# Patient Record
Sex: Female | Born: 1965 | Race: White | Hispanic: No | Marital: Married | Smoking: Never smoker
Health system: Southern US, Community
[De-identification: ages and names within clinical notes are randomized; demographics above are authoritative.]

## PROBLEM LIST (undated history)

## (undated) DIAGNOSIS — K219 Gastro-esophageal reflux disease without esophagitis: Secondary | ICD-10-CM

## (undated) DIAGNOSIS — E039 Hypothyroidism, unspecified: Secondary | ICD-10-CM

## (undated) DIAGNOSIS — R896 Abnormal cytological findings in specimens from other organs, systems and tissues: Secondary | ICD-10-CM

## (undated) DIAGNOSIS — J45909 Unspecified asthma, uncomplicated: Secondary | ICD-10-CM

## (undated) DIAGNOSIS — F909 Attention-deficit hyperactivity disorder, unspecified type: Secondary | ICD-10-CM

## (undated) HISTORY — DX: Gastro-esophageal reflux disease without esophagitis: K21.9

## (undated) HISTORY — DX: Hypothyroidism, unspecified: E03.9

## (undated) HISTORY — PX: TUBAL LIGATION: SHX77

## (undated) HISTORY — DX: Unspecified asthma, uncomplicated: J45.909

## (undated) HISTORY — DX: Abnormal cytological findings in specimens from other organs, systems and tissues: R89.6

## (undated) HISTORY — PX: AUGMENTATION MAMMAPLASTY: SUR837

---

## 2009-05-15 ENCOUNTER — Ambulatory Visit: Payer: Self-pay | Admitting: Diagnostic Radiology

## 2009-05-15 ENCOUNTER — Emergency Department (HOSPITAL_BASED_OUTPATIENT_CLINIC_OR_DEPARTMENT_OTHER): Admission: EM | Admit: 2009-05-15 | Discharge: 2009-05-15 | Payer: Self-pay | Admitting: Emergency Medicine

## 2009-11-12 ENCOUNTER — Emergency Department (HOSPITAL_BASED_OUTPATIENT_CLINIC_OR_DEPARTMENT_OTHER): Admission: EM | Admit: 2009-11-12 | Discharge: 2009-11-12 | Payer: Self-pay | Admitting: Emergency Medicine

## 2009-11-14 ENCOUNTER — Emergency Department (HOSPITAL_BASED_OUTPATIENT_CLINIC_OR_DEPARTMENT_OTHER): Admission: EM | Admit: 2009-11-14 | Discharge: 2009-11-14 | Payer: Self-pay | Admitting: Emergency Medicine

## 2010-04-05 DIAGNOSIS — IMO0001 Reserved for inherently not codable concepts without codable children: Secondary | ICD-10-CM

## 2010-04-05 HISTORY — DX: Reserved for inherently not codable concepts without codable children: IMO0001

## 2010-04-12 ENCOUNTER — Other Ambulatory Visit: Admission: RE | Admit: 2010-04-12 | Discharge: 2010-04-12 | Payer: Self-pay | Admitting: Gynecology

## 2010-04-12 ENCOUNTER — Ambulatory Visit: Payer: Self-pay | Admitting: Gynecology

## 2010-04-13 ENCOUNTER — Ambulatory Visit: Payer: Self-pay | Admitting: Gynecology

## 2010-05-01 ENCOUNTER — Emergency Department (HOSPITAL_BASED_OUTPATIENT_CLINIC_OR_DEPARTMENT_OTHER): Admission: EM | Admit: 2010-05-01 | Discharge: 2010-05-01 | Payer: Self-pay | Admitting: Emergency Medicine

## 2010-05-01 ENCOUNTER — Ambulatory Visit: Payer: Self-pay | Admitting: Diagnostic Radiology

## 2010-06-27 ENCOUNTER — Ambulatory Visit
Admission: RE | Admit: 2010-06-27 | Discharge: 2010-06-27 | Payer: Self-pay | Source: Home / Self Care | Attending: Gynecology | Admitting: Gynecology

## 2010-08-16 LAB — CBC
HCT: 39.1 % (ref 36.0–46.0)
Hemoglobin: 13.5 g/dL (ref 12.0–15.0)
MCHC: 34.6 g/dL (ref 30.0–36.0)
MCV: 88.3 fL (ref 78.0–100.0)

## 2010-08-16 LAB — PREGNANCY, URINE: Preg Test, Ur: NEGATIVE

## 2010-08-16 LAB — URINALYSIS, ROUTINE W REFLEX MICROSCOPIC
Bilirubin Urine: NEGATIVE
Ketones, ur: 15 mg/dL — AB
Nitrite: NEGATIVE
Urobilinogen, UA: 1 mg/dL (ref 0.0–1.0)

## 2010-08-16 LAB — COMPREHENSIVE METABOLIC PANEL
ALT: 14 U/L (ref 0–35)
Alkaline Phosphatase: 110 U/L (ref 39–117)
CO2: 20 mEq/L (ref 19–32)
Calcium: 8.9 mg/dL (ref 8.4–10.5)
GFR calc non Af Amer: >60 mL/min (ref >60)
Glucose, Bld: 119 mg/dL — ABNORMAL HIGH (ref 70–99)
Potassium: 3.6 mEq/L (ref 3.5–5.1)
Sodium: 143 mEq/L (ref 135–145)

## 2010-08-16 LAB — DIFFERENTIAL
Basophils Relative: 0 % (ref 0–1)
Eosinophils Absolute: 0 10*3/uL (ref 0.0–0.7)
Neutrophils Relative %: 91 % — ABNORMAL HIGH (ref 43–77)

## 2010-08-16 LAB — LIPASE, BLOOD: Lipase: 42 U/L (ref 23–300)

## 2010-08-22 LAB — DIFFERENTIAL
Basophils Absolute: 0.1 10*3/uL (ref 0.0–0.1)
Basophils Absolute: 0.2 10*3/uL — ABNORMAL HIGH (ref 0.0–0.1)
Basophils Relative: 1 % (ref 0–1)
Basophils Relative: 2 % — ABNORMAL HIGH (ref 0–1)
Eosinophils Absolute: 0.1 10*3/uL (ref 0.0–0.7)
Eosinophils Relative: 1 % (ref 0–5)
Eosinophils Relative: 1 % (ref 0–5)
Lymphocytes Relative: 13 % (ref 12–46)
Lymphocytes Relative: 15 % (ref 12–46)
Lymphs Abs: 1.4 10*3/uL (ref 0.7–4.0)
Monocytes Absolute: 0.5 10*3/uL (ref 0.1–1.0)
Monocytes Relative: 5 % (ref 3–12)
Neutro Abs: 8.7 10*3/uL — ABNORMAL HIGH (ref 1.7–7.7)
Neutrophils Relative %: 80 % — ABNORMAL HIGH (ref 43–77)

## 2010-08-22 LAB — CBC
HCT: 37.1 % (ref 36.0–46.0)
Hemoglobin: 12.7 g/dL (ref 12.0–15.0)
MCHC: 33.1 g/dL (ref 30.0–36.0)
MCHC: 34.2 g/dL (ref 30.0–36.0)
MCV: 88 fL (ref 78.0–100.0)
MCV: 88.7 fL (ref 78.0–100.0)
Platelets: 232 10*3/uL (ref 150–400)
Platelets: 245 10*3/uL (ref 150–400)
RBC: 4.22 MIL/uL (ref 3.87–5.11)
RDW: 12.9 % (ref 11.5–15.5)
RDW: 13.1 % (ref 11.5–15.5)
WBC: 10.9 10*3/uL — ABNORMAL HIGH (ref 4.0–10.5)
WBC: 13 10*3/uL — ABNORMAL HIGH (ref 4.0–10.5)

## 2010-08-22 LAB — BASIC METABOLIC PANEL
BUN: 12 mg/dL (ref 6–23)
CO2: 25 mEq/L (ref 19–32)
Calcium: 9.4 mg/dL (ref 8.4–10.5)
Chloride: 104 mEq/L (ref 96–112)
Creatinine, Ser: 1 mg/dL (ref 0.4–1.2)
GFR calc Af Amer: >60 mL/min (ref >60)
GFR calc non Af Amer: >60 mL/min (ref >60)
Glucose, Bld: 123 mg/dL — ABNORMAL HIGH (ref 70–99)
Potassium: 4.4 mEq/L (ref 3.5–5.1)
Sodium: 143 mEq/L (ref 135–145)

## 2010-08-22 LAB — COMPREHENSIVE METABOLIC PANEL
AST: 35 U/L (ref 0–37)
Albumin: 4 g/dL (ref 3.5–5.2)
Calcium: 9.1 mg/dL (ref 8.4–10.5)
Chloride: 103 mEq/L (ref 96–112)
Creatinine, Ser: 0.9 mg/dL (ref 0.4–1.2)
GFR calc Af Amer: >60 mL/min (ref >60)
Total Protein: 7.8 g/dL (ref 6.0–8.3)

## 2010-08-22 LAB — ROCKY MTN SPOTTED FVR AB, IGG-BLOOD: RMSF IgG: 0.05 IV

## 2010-08-22 LAB — B. BURGDORFI ANTIBODIES: B burgdorferi Ab IgG+IgM: 0.14 {ISR}

## 2010-09-06 LAB — URINALYSIS, ROUTINE W REFLEX MICROSCOPIC
Bilirubin Urine: NEGATIVE
Hgb urine dipstick: NEGATIVE
Ketones, ur: NEGATIVE mg/dL
Specific Gravity, Urine: 1.021 (ref 1.005–1.030)
pH: 6 (ref 5.0–8.0)

## 2010-09-06 LAB — COMPREHENSIVE METABOLIC PANEL
ALT: 18 U/L (ref 0–35)
Alkaline Phosphatase: 102 U/L (ref 39–117)
BUN: 11 mg/dL (ref 6–23)
CO2: 24 mEq/L (ref 19–32)
Calcium: 9.3 mg/dL (ref 8.4–10.5)
GFR calc non Af Amer: >60 mL/min (ref >60)
Glucose, Bld: 112 mg/dL — ABNORMAL HIGH (ref 70–99)
Total Protein: 7.6 g/dL (ref 6.0–8.3)

## 2010-09-06 LAB — DIFFERENTIAL
Basophils Relative: 1 % (ref 0–1)
Eosinophils Absolute: 0.2 10*3/uL (ref 0.0–0.7)
Monocytes Relative: 6 % (ref 3–12)
Neutro Abs: 4.3 10*3/uL (ref 1.7–7.7)
Neutrophils Relative %: 60 % (ref 43–77)

## 2010-09-06 LAB — POCT CARDIAC MARKERS
Myoglobin, poc: 41.4 ng/mL (ref 12–200)
Troponin i, poc: <0.05 ng/mL (ref 0.00–0.09)

## 2010-09-06 LAB — POCT TOXICOLOGY PANEL

## 2010-09-06 LAB — CBC
HCT: 38.8 % (ref 36.0–46.0)
Hemoglobin: 13.5 g/dL (ref 12.0–15.0)
MCHC: 34.6 g/dL (ref 30.0–36.0)
RBC: 4.38 MIL/uL (ref 3.87–5.11)
RDW: 12.8 % (ref 11.5–15.5)

## 2010-09-06 LAB — LIPASE, BLOOD: Lipase: 47 U/L (ref 23–300)

## 2010-09-06 LAB — URINE MICROSCOPIC-ADD ON

## 2010-09-06 LAB — D-DIMER, QUANTITATIVE: D-Dimer, Quant: <0.22 ug/mL-FEU (ref 0.00–0.48)

## 2010-09-20 ENCOUNTER — Ambulatory Visit (HOSPITAL_COMMUNITY): Payer: Self-pay | Admitting: Psychology

## 2010-10-07 ENCOUNTER — Ambulatory Visit (HOSPITAL_COMMUNITY): Payer: Private Health Insurance - Indemnity | Admitting: Physician Assistant

## 2010-10-07 DIAGNOSIS — F4323 Adjustment disorder with mixed anxiety and depressed mood: Secondary | ICD-10-CM

## 2010-11-03 ENCOUNTER — Encounter (HOSPITAL_COMMUNITY): Payer: Private Health Insurance - Indemnity | Admitting: Physician Assistant

## 2010-11-04 ENCOUNTER — Encounter (HOSPITAL_COMMUNITY): Payer: Private Health Insurance - Indemnity | Admitting: Physician Assistant

## 2010-11-07 ENCOUNTER — Ambulatory Visit (HOSPITAL_COMMUNITY): Payer: Private Health Insurance - Indemnity | Admitting: Marriage and Family Therapist

## 2010-11-26 ENCOUNTER — Emergency Department (HOSPITAL_COMMUNITY): Payer: Private Health Insurance - Indemnity

## 2010-11-26 ENCOUNTER — Emergency Department (HOSPITAL_COMMUNITY)
Admission: EM | Admit: 2010-11-26 | Discharge: 2010-11-26 | Disposition: A | Discharge status: Home / Self Care | Payer: Private Health Insurance - Indemnity | Attending: Emergency Medicine | Admitting: Emergency Medicine

## 2010-11-26 DIAGNOSIS — L298 Other pruritus: Secondary | ICD-10-CM | POA: Insufficient documentation

## 2010-11-26 DIAGNOSIS — F988 Other specified behavioral and emotional disorders with onset usually occurring in childhood and adolescence: Secondary | ICD-10-CM | POA: Insufficient documentation

## 2010-11-26 DIAGNOSIS — L2989 Other pruritus: Secondary | ICD-10-CM | POA: Insufficient documentation

## 2010-11-26 DIAGNOSIS — R071 Chest pain on breathing: Secondary | ICD-10-CM | POA: Insufficient documentation

## 2010-11-26 DIAGNOSIS — E039 Hypothyroidism, unspecified: Secondary | ICD-10-CM | POA: Insufficient documentation

## 2010-11-26 DIAGNOSIS — Z79899 Other long term (current) drug therapy: Secondary | ICD-10-CM | POA: Insufficient documentation

## 2010-11-26 LAB — CBC
MCV: 87 fL (ref 78.0–100.0)
Platelets: 251 10*3/uL (ref 150–400)
RDW: 12.7 % (ref 11.5–15.5)
WBC: 7.3 10*3/uL (ref 4.0–10.5)

## 2010-11-26 LAB — COMPREHENSIVE METABOLIC PANEL
Albumin: 3.2 g/dL — ABNORMAL LOW (ref 3.5–5.2)
BUN: 7 mg/dL (ref 6–23)
Calcium: 9.2 mg/dL (ref 8.4–10.5)
Chloride: 102 mEq/L (ref 96–112)
Creatinine, Ser: 0.78 mg/dL (ref 0.50–1.10)
Total Bilirubin: 0.4 mg/dL (ref 0.3–1.2)

## 2010-11-26 LAB — CK TOTAL AND CKMB (NOT AT ARMC)
CK, MB: 1.6 ng/mL (ref 0.3–4.0)
Relative Index: INVALID (ref 0.0–2.5)
Total CK: 77 U/L (ref 7–177)

## 2010-12-15 ENCOUNTER — Emergency Department (HOSPITAL_COMMUNITY): Payer: Private Health Insurance - Indemnity

## 2010-12-15 ENCOUNTER — Emergency Department (HOSPITAL_COMMUNITY)
Admission: EM | Admit: 2010-12-15 | Discharge: 2010-12-15 | Disposition: A | Discharge status: Home / Self Care | Payer: Private Health Insurance - Indemnity | Attending: Emergency Medicine | Admitting: Emergency Medicine

## 2010-12-15 DIAGNOSIS — R1011 Right upper quadrant pain: Secondary | ICD-10-CM | POA: Insufficient documentation

## 2010-12-15 DIAGNOSIS — E039 Hypothyroidism, unspecified: Secondary | ICD-10-CM | POA: Insufficient documentation

## 2010-12-15 DIAGNOSIS — R1110 Vomiting, unspecified: Secondary | ICD-10-CM | POA: Insufficient documentation

## 2010-12-15 DIAGNOSIS — F988 Other specified behavioral and emotional disorders with onset usually occurring in childhood and adolescence: Secondary | ICD-10-CM | POA: Insufficient documentation

## 2010-12-15 LAB — DIFFERENTIAL
Basophils Absolute: 0.1 10*3/uL (ref 0.0–0.1)
Basophils Relative: 1 % (ref 0–1)
Monocytes Relative: 5 % (ref 3–12)
Neutro Abs: 6.2 10*3/uL (ref 1.7–7.7)
Neutrophils Relative %: 70 % (ref 43–77)

## 2010-12-15 LAB — HEPATIC FUNCTION PANEL
ALT: 12 U/L (ref 0–35)
AST: 17 U/L (ref 0–37)
Albumin: 3.9 g/dL (ref 3.5–5.2)
Alkaline Phosphatase: 74 U/L (ref 39–117)
Bilirubin, Direct: 0.1 mg/dL (ref 0.0–0.3)
Indirect Bilirubin: 0.2 mg/dL — ABNORMAL LOW (ref 0.3–0.9)
Total Bilirubin: 0.3 mg/dL (ref 0.3–1.2)
Total Protein: 7.5 g/dL (ref 6.0–8.3)

## 2010-12-15 LAB — URINALYSIS, ROUTINE W REFLEX MICROSCOPIC
Hgb urine dipstick: NEGATIVE
Nitrite: NEGATIVE
Specific Gravity, Urine: 1.025 (ref 1.005–1.030)
pH: 5.5 (ref 5.0–8.0)

## 2010-12-15 LAB — CBC
Hemoglobin: 14.7 g/dL (ref 12.0–15.0)
MCH: 31.3 pg (ref 26.0–34.0)
RBC: 4.7 MIL/uL (ref 3.87–5.11)

## 2010-12-15 LAB — BASIC METABOLIC PANEL
CO2: 23 mEq/L (ref 19–32)
Glucose, Bld: 113 mg/dL — ABNORMAL HIGH (ref 70–99)
Potassium: 4.1 mEq/L (ref 3.5–5.1)
Sodium: 134 mEq/L — ABNORMAL LOW (ref 135–145)

## 2010-12-15 LAB — LIPASE, BLOOD: Lipase: 16 U/L (ref 11–59)

## 2011-01-27 ENCOUNTER — Emergency Department (HOSPITAL_COMMUNITY)
Admission: EM | Admit: 2011-01-27 | Discharge: 2011-01-27 | Disposition: A | Discharge status: Home / Self Care | Payer: Self-pay | Attending: Emergency Medicine | Admitting: Emergency Medicine

## 2011-01-27 ENCOUNTER — Emergency Department (HOSPITAL_COMMUNITY): Payer: Self-pay

## 2011-01-27 DIAGNOSIS — R109 Unspecified abdominal pain: Secondary | ICD-10-CM | POA: Insufficient documentation

## 2011-01-27 DIAGNOSIS — E039 Hypothyroidism, unspecified: Secondary | ICD-10-CM | POA: Insufficient documentation

## 2011-01-27 DIAGNOSIS — R112 Nausea with vomiting, unspecified: Secondary | ICD-10-CM | POA: Insufficient documentation

## 2011-01-27 DIAGNOSIS — N390 Urinary tract infection, site not specified: Secondary | ICD-10-CM | POA: Insufficient documentation

## 2011-01-27 LAB — POCT I-STAT, CHEM 8
BUN: <3 mg/dL — ABNORMAL LOW (ref 6–23)
Creatinine, Ser: 0.9 mg/dL (ref 0.50–1.10)
Glucose, Bld: 135 mg/dL — ABNORMAL HIGH (ref 70–99)
Hemoglobin: 12.2 g/dL (ref 12.0–15.0)
Potassium: 3.3 mEq/L — ABNORMAL LOW (ref 3.5–5.1)

## 2011-01-27 LAB — URINALYSIS, ROUTINE W REFLEX MICROSCOPIC
Glucose, UA: NEGATIVE mg/dL
Leukocytes, UA: NEGATIVE
Protein, ur: 30 mg/dL — AB
Specific Gravity, Urine: 1.004 — ABNORMAL LOW (ref 1.005–1.030)
pH: 7 (ref 5.0–8.0)

## 2011-01-27 LAB — HEPATIC FUNCTION PANEL
ALT: 10 U/L (ref 0–35)
Indirect Bilirubin: 0.2 mg/dL — ABNORMAL LOW (ref 0.3–0.9)
Total Protein: 7 g/dL (ref 6.0–8.3)

## 2011-01-27 LAB — DIFFERENTIAL
Basophils Absolute: 0 10*3/uL (ref 0.0–0.1)
Lymphocytes Relative: 11 % — ABNORMAL LOW (ref 12–46)
Monocytes Absolute: 1 10*3/uL (ref 0.1–1.0)
Monocytes Relative: 9 % (ref 3–12)
Neutro Abs: 8.9 10*3/uL — ABNORMAL HIGH (ref 1.7–7.7)

## 2011-01-27 LAB — CBC
HCT: 35.3 % — ABNORMAL LOW (ref 36.0–46.0)
Hemoglobin: 11.7 g/dL — ABNORMAL LOW (ref 12.0–15.0)
MCHC: 33.1 g/dL (ref 30.0–36.0)
RBC: 3.88 MIL/uL (ref 3.87–5.11)

## 2011-01-27 LAB — URINE MICROSCOPIC-ADD ON

## 2011-01-27 LAB — LIPASE, BLOOD: Lipase: 20 U/L (ref 11–59)

## 2011-01-29 LAB — URINE CULTURE: Culture  Setup Time: 201208250110

## 2011-04-24 ENCOUNTER — Encounter: Payer: Self-pay | Admitting: *Deleted

## 2011-04-25 ENCOUNTER — Encounter: Payer: Private Health Insurance - Indemnity | Admitting: Gynecology

## 2012-07-23 ENCOUNTER — Encounter (HOSPITAL_COMMUNITY): Payer: Self-pay | Admitting: Emergency Medicine

## 2012-07-23 ENCOUNTER — Emergency Department (HOSPITAL_COMMUNITY): Payer: BC Managed Care – PPO

## 2012-07-23 ENCOUNTER — Emergency Department (HOSPITAL_COMMUNITY)
Admission: EM | Admit: 2012-07-23 | Discharge: 2012-07-23 | Disposition: A | Discharge status: Home / Self Care | Payer: BC Managed Care – PPO | Attending: Emergency Medicine | Admitting: Emergency Medicine

## 2012-07-23 DIAGNOSIS — Z79899 Other long term (current) drug therapy: Secondary | ICD-10-CM | POA: Insufficient documentation

## 2012-07-23 DIAGNOSIS — M542 Cervicalgia: Secondary | ICD-10-CM | POA: Insufficient documentation

## 2012-07-23 DIAGNOSIS — Z791 Long term (current) use of non-steroidal anti-inflammatories (NSAID): Secondary | ICD-10-CM | POA: Insufficient documentation

## 2012-07-23 DIAGNOSIS — M2550 Pain in unspecified joint: Secondary | ICD-10-CM | POA: Insufficient documentation

## 2012-07-23 DIAGNOSIS — R51 Headache: Secondary | ICD-10-CM | POA: Insufficient documentation

## 2012-07-23 DIAGNOSIS — R079 Chest pain, unspecified: Secondary | ICD-10-CM | POA: Insufficient documentation

## 2012-07-23 DIAGNOSIS — R202 Paresthesia of skin: Secondary | ICD-10-CM

## 2012-07-23 DIAGNOSIS — H539 Unspecified visual disturbance: Secondary | ICD-10-CM | POA: Insufficient documentation

## 2012-07-23 DIAGNOSIS — IMO0001 Reserved for inherently not codable concepts without codable children: Secondary | ICD-10-CM | POA: Insufficient documentation

## 2012-07-23 DIAGNOSIS — R109 Unspecified abdominal pain: Secondary | ICD-10-CM | POA: Insufficient documentation

## 2012-07-23 DIAGNOSIS — R209 Unspecified disturbances of skin sensation: Secondary | ICD-10-CM | POA: Insufficient documentation

## 2012-07-23 LAB — CBC WITH DIFFERENTIAL/PLATELET
Basophils Absolute: 0 10*3/uL (ref 0.0–0.1)
Basophils Relative: 0 % (ref 0–1)
Eosinophils Absolute: 0.2 10*3/uL (ref 0.0–0.7)
Eosinophils Relative: 2 % (ref 0–5)
HCT: 38.6 % (ref 36.0–46.0)
MCH: 30.7 pg (ref 26.0–34.0)
MCHC: 34.7 g/dL (ref 30.0–36.0)
MCV: 88.5 fL (ref 78.0–100.0)
Monocytes Absolute: 0.6 10*3/uL (ref 0.1–1.0)
Monocytes Relative: 8 % (ref 3–12)
RDW: 13.1 % (ref 11.5–15.5)

## 2012-07-23 LAB — POCT I-STAT TROPONIN I: Troponin i, poc: 0.01 ng/mL (ref 0.00–0.08)

## 2012-07-23 LAB — BASIC METABOLIC PANEL
BUN: 12 mg/dL (ref 6–23)
Calcium: 9.3 mg/dL (ref 8.4–10.5)
Creatinine, Ser: 0.82 mg/dL (ref 0.50–1.10)
GFR calc Af Amer: >90 mL/min (ref >90)

## 2012-07-23 MED ORDER — ONDANSETRON HCL 4 MG PO TABS: 4.0000 mg | ORAL_TABLET | Freq: Four times a day (QID) | ORAL | Status: DC

## 2012-07-23 MED ORDER — ONDANSETRON HCL 4 MG/2ML IJ SOLN
4.0000 mg | Freq: Once | INTRAMUSCULAR | Status: AC
  Administered 2012-07-23: 4 mg via INTRAVENOUS
  Filled 2012-07-23: qty 2

## 2012-07-23 MED ORDER — MORPHINE SULFATE 4 MG/ML IJ SOLN
4.0000 mg | Freq: Once | INTRAMUSCULAR | Status: AC
  Administered 2012-07-23: 4 mg via INTRAVENOUS
  Filled 2012-07-23: qty 1

## 2012-07-23 MED ORDER — ONDANSETRON HCL 4 MG PO TABS
4.0000 mg | ORAL_TABLET | Freq: Four times a day (QID) | ORAL | Status: DC
Start: 2012-07-23 — End: 2012-09-11

## 2012-07-23 MED ORDER — HYDROCODONE-ACETAMINOPHEN 5-325 MG PO TABS: 2.0000 | ORAL_TABLET | ORAL | Status: DC | PRN

## 2012-07-23 MED ORDER — HYDROCODONE-ACETAMINOPHEN 5-325 MG PO TABS
1.0000 | ORAL_TABLET | Freq: Once | ORAL | Status: AC
  Administered 2012-07-23: 1 via ORAL
  Filled 2012-07-23: qty 1

## 2012-07-23 NOTE — ED Notes (Signed)
Pt transported to radiology.

## 2012-07-23 NOTE — ED Provider Notes (Signed)
History     CSN: [id number]  Arrival date & time 07/23/12  1555   First MD Initiated Contact with Patient 07/23/12 1633      Chief Complaint  Patient presents with  . Numbness    (Consider location/radiation/quality/duration/timing/severity/associated sxs/prior treatment) Patient is a 47 y.o. female presenting with neurologic complaint.  Neurologic Problem The primary symptoms include headaches and paresthesias. Primary symptoms do not include fever, nausea or vomiting. The symptoms began 5 to 7 days ago. The episode lasted 7 days. The symptoms are worsening. The neurological symptoms are multifocal (L side of body).  The headache is associated with paresthesias. The headache is not associated with photophobia, neck stiffness, weakness or loss of balance.   Paresthesias began greater than 24 hours ago. The paresthesias are worsening. The paresthesias are described as burning and pricking. Affected locations include the: head, neck, left side of the face, left shoulder, left upper arm, left forearm, left hand, chest, back, abdomen, left thigh, left distal leg and left foot.  Additional symptoms include pain. Additional symptoms do not include neck stiffness, weakness, loss of balance, photophobia, vertigo or anxiety. Associated medical issues comments: myelofibrosis.    Past Medical History  Diagnosis Date  . ASCUS (atypical squamous cells of undetermined significance) on Pap smear 04/2010    NEG HPV    Past Surgical History  Procedure Laterality Date  . Tubal ligation    . Augmentation mammaplasty      History reviewed. No pertinent family history.  History  Substance Use Topics  . Smoking status: Never Smoker   . Smokeless tobacco: Never Used  . Alcohol Use: Yes     Comment: 1-3 WEEK    OB History   Grav Para Term Preterm Abortions TAB SAB Ect Mult Living   4 3   1     3       Review of Systems  Constitutional: Negative for fever, chills, diaphoresis, activity  change, appetite change and fatigue.  HENT: Positive for neck pain. Negative for congestion, sore throat, facial swelling, rhinorrhea and neck stiffness.   Eyes: Positive for visual disturbance. Negative for photophobia and discharge.  Respiratory: Negative for cough, chest tightness and shortness of breath.   Cardiovascular: Positive for chest pain. Negative for palpitations and leg swelling.  Gastrointestinal: Positive for abdominal pain. Negative for nausea, vomiting and diarrhea.  Endocrine: Negative for polydipsia and polyuria.  Genitourinary: Negative for dysuria, frequency, difficulty urinating and pelvic pain.  Musculoskeletal: Positive for myalgias and arthralgias. Negative for back pain.  Skin: Negative for color change and wound.  Allergic/Immunologic: Negative for immunocompromised state.  Neurological: Positive for headaches and paresthesias. Negative for vertigo, facial asymmetry, weakness, numbness and loss of balance.  Hematological: Does not bruise/bleed easily.  Psychiatric/Behavioral: Negative for confusion and agitation.    Allergies  Review of patient's allergies indicates no known allergies.  Home Medications   Current Outpatient Rx  Name  Route  Sig  Dispense  Refill  . amphetamine-dextroamphetamine (ADDERALL) 20 MG tablet   Oral   Take 20 mg by mouth 3 (three) times daily.         Marland Kitchen amphetamine-dextroamphetamine (ADDERALL) 20 MG tablet   Oral   Take 20 mg by mouth daily as needed (may take additional adderall tablet if needed for school work).         . Biotin 10 MG TABS   Oral   Take 10 mg by mouth daily.         Marland Kitchen  ibuprofen (ADVIL,MOTRIN) 800 MG tablet   Oral   Take 800 mg by mouth 3 (three) times daily.         Marland Kitchen HYDROcodone-acetaminophen (NORCO/VICODIN) 5-325 MG per tablet   Oral   Take 2 tablets by mouth every 4 (four) hours as needed for pain.   10 tablet   0   . ondansetron (ZOFRAN) 4 MG tablet   Oral   Take 1 tablet (4 mg total)  by mouth every 6 (six) hours.   12 tablet   0     BP 121/61  Pulse 62  Temp(Src) 98.2 F (36.8 C) (Oral)  Resp 12  SpO2 98%  LMP 07/02/2012  Physical Exam  Constitutional: She is oriented to person, place, and time. She appears well-developed and well-nourished. No distress.  HENT:  Head: Normocephalic and atraumatic.  Mouth/Throat: No oropharyngeal exudate.  Eyes: Pupils are equal, round, and reactive to light.  Neck: Normal range of motion. Neck supple.  Cardiovascular: Normal rate, regular rhythm and normal heart sounds.  Exam reveals no gallop and no friction rub.   No murmur heard. Pulmonary/Chest: Effort normal and breath sounds normal. No respiratory distress. She has no wheezes. She has no rales.  Abdominal: Soft. Bowel sounds are normal. She exhibits no distension and no mass. There is no tenderness. There is no rebound and no guarding.  Musculoskeletal: Normal range of motion. She exhibits no edema and no tenderness.  Neurological: She is alert and oriented to person, place, and time. A sensory deficit is present. No cranial nerve deficit. GCS eye subscore is 4. GCS verbal subscore is 5. GCS motor subscore is 6.  Appears to have severe pain to light touch of entire L side of her body. DEC LUE/LLE strength likely due to pain.  Pt able to ambulate to bathroom.    Skin: Skin is warm and dry.  Psychiatric: She has a normal mood and affect.    ED Course  Procedures (including critical care time)  Labs Reviewed  BASIC METABOLIC PANEL - Abnormal; Notable for the following:    Glucose, Bld 109 (*)    GFR calc non Af Amer 85 (*)    All other components within normal limits  GLUCOSE, CAPILLARY - Abnormal; Notable for the following:    Glucose-Capillary 104 (*)    All other components within normal limits  CBC WITH DIFFERENTIAL  POCT I-STAT TROPONIN I   Ct Head Wo Contrast  07/23/2012  *RADIOLOGY REPORT*  Clinical Data: Left-sided numbness.  CT HEAD WITHOUT CONTRAST   Technique:  Contiguous axial images were obtained from the base of the skull through the vertex without contrast.  Comparison: 05/15/2009  Findings: No acute intracranial abnormality.  Specifically, no hemorrhage, hydrocephalus, mass lesion, acute infarction, or significant intracranial injury.  No acute calvarial abnormality. Visualized paranasal sinuses and mastoids clear.  Orbital soft tissues unremarkable.  IMPRESSION: Normal study.   Original Report Authenticated By: Charlett Nose, M.D.      1. Pins and needles sensation   2. Paresthesia of left arm and leg       MDM  Pt is a 47 y.o. female with pertinent PMHX of myelofibrosis who presents with L sided pain/paresthesias.  She reports she has had similar symptoms in past w/ her myelofibrosis, and has had chronic intermittent pain of R arm for about 1.5 years, but pain worsening for past week and now involving L face, neck, chest, back, abdomen, leg.  She has also had intermittent blurry vision.  Pain worse w/ even light touch & worse w/ mvmt.  No known injuries or recent illness. VSS, pt in NAD, appear uncomfortable during neuro exam which is difficult due to pain. 4/5 strength LUE, LLE likely due to pain, no CN deficits.  Walked to bathroom after exam. Will attempt to obtain OSH records from [facility] [place] [place] where pt has had care.  CBC, BMP, trop unremarkable.  Have added CT head, but doubt CVA, TIA, acute intracranial bleed. Will r/u electrolyte inbalence.   7:32PM After pt's husband left she called me back in room and reported that I would not be able to find OSH records on her as she used her sister's name when she had her care in [place] as her husband and her were separated and he had cancelled her insurance.  CT, CBC, BMP, CT head here normal, I do not feel she has reason for further emergent w/u.  Will treat pain and d/c home w/ plan for establishment with new oncologist for further workup as well as establishment  with new PCP.  Given timeframe of symptoms and negative CT, doubt CVA, intracranial bleed.    1. Pins and needles sensation   2. Paresthesia of left arm and leg      Labs and imaging considered in decision making, reviewed by myself.  Imaging interpreted by radiology. Pt care discussed with my attending, Dr. Jeraldine Loots.         Toy Cookey, MD 07/24/12 [phone]

## 2012-07-23 NOTE — ED Notes (Signed)
Per EMS pt has hx of myleofibrosis which she is not being treated for and symptoms have increased over the past few days. Pt c/o numbness and pain to entire left side of body. Negative stroke screen.

## 2012-07-24 ENCOUNTER — Telehealth: Payer: Self-pay | Admitting: Oncology

## 2012-07-24 NOTE — Telephone Encounter (Signed)
C/D 07/24/12 for appt. 08/02/12

## 2012-07-24 NOTE — Telephone Encounter (Signed)
S/W pt husband in re NP appt 02/28 @ 10:30 w/Dr. Clelia Croft.  Referring - ED Referral Welcome packet mailed.

## 2012-07-25 NOTE — ED Provider Notes (Signed)
  I performed a history and physical examination of Joy Wilson and discussed her management with Dr. Micheline Maze.  I agree with the history, physical, assessment, and plan of care, with the following exceptions: None  On my exam the patient was in no distress.  Though the patient has hematologic illness, her labs today were unremarkable.  We discussed the need for outpatient followup, and absence of distress, with improvement in her pain, she was stable for discharge. Joy Jarvis, MD 07/25/12 [phone]

## 2012-08-01 ENCOUNTER — Other Ambulatory Visit: Payer: Self-pay | Admitting: Oncology

## 2012-08-02 ENCOUNTER — Ambulatory Visit: Payer: BC Managed Care – PPO

## 2012-08-02 ENCOUNTER — Ambulatory Visit: Payer: BC Managed Care – PPO | Admitting: Oncology

## 2012-08-02 ENCOUNTER — Other Ambulatory Visit: Payer: BC Managed Care – PPO | Admitting: Lab

## 2012-08-20 ENCOUNTER — Ambulatory Visit: Payer: BC Managed Care – PPO

## 2012-08-20 ENCOUNTER — Ambulatory Visit: Payer: BC Managed Care – PPO | Admitting: Oncology

## 2012-08-20 ENCOUNTER — Other Ambulatory Visit: Payer: BC Managed Care – PPO | Admitting: Lab

## 2012-09-10 ENCOUNTER — Telehealth: Payer: Self-pay | Admitting: *Deleted

## 2012-09-10 NOTE — Telephone Encounter (Signed)
Called mobile # not in service. LMOVM at home# requesting pt call back confirming 09/11/12 appt.

## 2012-09-11 ENCOUNTER — Encounter: Payer: Self-pay | Admitting: Oncology

## 2012-09-11 ENCOUNTER — Telehealth: Payer: Self-pay | Admitting: Oncology

## 2012-09-11 ENCOUNTER — Ambulatory Visit: Payer: BC Managed Care – PPO

## 2012-09-11 ENCOUNTER — Ambulatory Visit (HOSPITAL_BASED_OUTPATIENT_CLINIC_OR_DEPARTMENT_OTHER): Payer: BC Managed Care – PPO | Admitting: Oncology

## 2012-09-11 ENCOUNTER — Other Ambulatory Visit (HOSPITAL_BASED_OUTPATIENT_CLINIC_OR_DEPARTMENT_OTHER): Payer: BC Managed Care – PPO | Admitting: Lab

## 2012-09-11 VITALS — BP 127/67 | HR 82 | Temp 97.4°F | Resp 18 | Ht 61.0 in | Wt 163.7 lb

## 2012-09-11 DIAGNOSIS — I998 Other disorder of circulatory system: Secondary | ICD-10-CM

## 2012-09-11 DIAGNOSIS — D649 Anemia, unspecified: Secondary | ICD-10-CM

## 2012-09-11 DIAGNOSIS — M899 Disorder of bone, unspecified: Secondary | ICD-10-CM

## 2012-09-11 DIAGNOSIS — D7581 Myelofibrosis: Secondary | ICD-10-CM

## 2012-09-11 LAB — COMPREHENSIVE METABOLIC PANEL (CC13)
AST: 17 U/L (ref 5–34)
Albumin: 4 g/dL (ref 3.5–5.0)
BUN: 13.2 mg/dL (ref 7.0–26.0)
Calcium: 9.1 mg/dL (ref 8.4–10.4)
Chloride: 105 mEq/L (ref 98–107)
Creatinine: 0.9 mg/dL (ref 0.6–1.1)
Glucose: 87 mg/dl (ref 70–99)
Potassium: 3.9 mEq/L (ref 3.5–5.1)

## 2012-09-11 LAB — CBC WITH DIFFERENTIAL/PLATELET
Basophils Absolute: 0 10*3/uL (ref 0.0–0.1)
EOS%: 0.9 % (ref 0.0–7.0)
Eosinophils Absolute: 0.1 10*3/uL (ref 0.0–0.5)
HCT: 38 % (ref 34.8–46.6)
HGB: 13 g/dL (ref 11.6–15.9)
MCH: 29.8 pg (ref 25.1–34.0)
MCV: 87.2 fL (ref 79.5–101.0)
MONO%: 7.5 % (ref 0.0–14.0)
NEUT#: 4.6 10*3/uL (ref 1.5–6.5)
NEUT%: 65.9 % (ref 38.4–76.8)
RDW: 12.8 % (ref 11.2–14.5)
lymph#: 1.8 10*3/uL (ref 0.9–3.3)

## 2012-09-11 NOTE — Telephone Encounter (Signed)
Gave pt appt for August 2014 lab and MD °

## 2012-09-11 NOTE — Telephone Encounter (Signed)
Gave pt appt for August 2014 lab and MD

## 2012-09-11 NOTE — Progress Notes (Signed)
Checked in new pt with no financial concerns. °

## 2012-09-11 NOTE — Progress Notes (Signed)
Note dictated

## 2012-09-12 NOTE — Progress Notes (Signed)
REASON FOR CONSULTATION:  History of myelofibrosis.  HISTORY OF PRESENT ILLNESS:  Joy Wilson is a 47 year old woman currently of , originally from [place] and lived in multiple locations.  She was living here in [place], relocated to [place] for about 2 years, and returned in around February 2014.  She has really no significant past medical history.  She reports a history of ADHD as an adult and she has been on Adderall, which has helped her symptoms. All throughout her childhood and early adulthood had had constitutional symptoms that ranged from fevers and recurrent infections and bruising and while she was living in [place] she was, per her report, evaluated by her cousin who is a primary care physician and referred her to Oncology and Hematology for a workup.  Per her report, she had an ultrasound showing she had an enlarged spleen and subsequently found to be anemic and had a bone marrow biopsy, and at that time she was told that she has myelofibrosis.  She received a blood transfusion, a total of 3 units, and was started on anti JAK2 inhibitors.  Apparently, her blood counts normalized and she stopped her medication and relocated to [facility] and has felt very well, although she was seen in the emergency department on 07/23/2012 with complaints of paresthesias and headache.  Her workup was really unremarkable and was referred from the emergency department to the [facility] for evaluation for possible myelofibrosis.  Clinically, she feels relatively fair.  She does report pain in her left forearm, pain in her left shin as well as her ribs.  She is still able to perform most activities of daily living. 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  Other than that, she had not had any fevers, had not had any chills, had not had any weight loss.  Appetite had been reasonable.  Again, performance status and activity level are really  unremarkable.  REVIEW OF SYSTEMS:  Does not report any headaches, blurry vision, double vision.  Does not report any motor or sensory neuropathy.  Does not report any alteration in mental status.  Does not report any psychiatric issues, depression.  Does not report any fever, chills, sweats.  Does not report any cough, hemoptysis, hematemesis.  No nausea or vomiting, abdominal pain, hematochezia, melena, or genitourinary complaints.  Rest of review of systems is unremarkable.  PAST MEDICAL HISTORY:  Remarkable for ADHD, history of atypical squamous cells on a Pap smear.  She has had a history of tubal ligation and breast augmentation.  MEDICATION:  She is on ibuprofen as needed, as well as Adderall.  ALLERGIES:  None.  SOCIAL HISTORY:  She is married.  She has 3 children.  Denied any alcohol or tobacco abuse.  FAMILY HISTORY:  Her mother died of a car accident.  She did not know much about her father's family history.  She has 2 aunts with cancer.  PHYSICAL EXAMINATION:  General:  Alert, awake, very pleasant woman, appeared in no active distress.  Vital Signs:  Blood pressure is 127/67, pulse 82, respirations 18, temperature is 98, weighs 163 pounds.  HEENT: Head is normocephalic, atraumatic.  Pupils equal and round, reactive to light.  Oral mucosa moist and pink.  Neck:  Supple.  No lymphadenopathy. Heart:  Regular rate and rhythm.  S1 and S2.  Lungs:  Clear to auscultation without rhonchi, wheeze, dullness to percussion.  Abdomen: Soft, nontender.  No hepatosplenomegaly.  Extremities:  No clubbing, cyanosis, or  edema.  Neurologic:  Intact motor, sensory, and deep tendon reflexes.  I could not appreciate any splenomegaly today.  LABORATORY DATA:  Hemoglobin of 13, white cell count of 7.0, platelet count of 271.  Peripheral smear was personally reviewed today.  I could not appreciate any polychromasia, could not appreciate any nucleated red cells or any dysplasia, could not  appreciate any immature red or white cells.  Again, I could not appreciate any schistocytosis or red cell fragmentation or abnormalities.  ASSESSMENT AND PLAN:  A 47 year old woman with a presumed history of a myelofibrosis.  She gives a history of diffuse bony pain, as well as bruising and history of anemia, which are certainly consistent with myelofibrosis.  She also gives history of having a bone marrow biopsy, ultrasound, as well as being on a JAK2 inhibitor.  I discussed the natural course of myelofibrosis, as well as the different treatment options.  At this time, before we proceed with any further management, I would like to get confirmation of her diagnosis.  I think the easiest way to do this is if we could obtain her medical records so we can spare her any repeated testing at this point.  At this point, she does not recall the name of the doctor nor the location where she was seen and treated.  She recalls it is possibly in [place], [place].  At this time, I urged her to get as much record and as much information for [place] as possible, even if a phone number of a possible provider that we can contact and get that information.  It would be paramount in moving forward.  Otherwise, we will have to repeat any testing.  At this time, her hemoglobin and peripheral smear as well as CBC in general look perfectly normal, so I do not really see any urgent need for any intervention.  Does not need any growth factor support, she does not really need any transfusion, she does not need any anti JAK2 agents that warrant any intervention.  At the same time, I do not want to repeat any testing that has already been done.  So for now, will continue observation and surveillance.  Her pain seems to be manageable, she is able to function, and she seems to be doing fairly well with her current regimen.  I did urge her to establish care with a primary care physician for other health maintenance issue  and I will see her back in 4 months' time, sooner if needed to.  All her questions were answered today.    ______________________________ Benjiman Core, M.D. FNS/MEDQ  D:  09/11/2012  T:  09/12/2012  Job:  [id number]

## 2012-09-20 ENCOUNTER — Telehealth: Payer: Self-pay | Admitting: Dietician

## 2012-10-15 ENCOUNTER — Ambulatory Visit: Payer: BC Managed Care – PPO | Admitting: Internal Medicine

## 2012-10-15 DIAGNOSIS — Z0289 Encounter for other administrative examinations: Secondary | ICD-10-CM

## 2012-12-19 ENCOUNTER — Encounter (HOSPITAL_COMMUNITY): Payer: Self-pay | Admitting: *Deleted

## 2012-12-19 ENCOUNTER — Emergency Department (INDEPENDENT_AMBULATORY_CARE_PROVIDER_SITE_OTHER): Payer: BC Managed Care – PPO

## 2012-12-19 ENCOUNTER — Emergency Department (HOSPITAL_COMMUNITY)
Admission: EM | Admit: 2012-12-19 | Discharge: 2012-12-19 | Disposition: A | Discharge status: Home / Self Care | Payer: BC Managed Care – PPO | Source: Home / Self Care

## 2012-12-19 DIAGNOSIS — S52123A Displaced fracture of head of unspecified radius, initial encounter for closed fracture: Secondary | ICD-10-CM

## 2012-12-19 DIAGNOSIS — M25539 Pain in unspecified wrist: Secondary | ICD-10-CM

## 2012-12-19 DIAGNOSIS — S52121A Displaced fracture of head of right radius, initial encounter for closed fracture: Secondary | ICD-10-CM

## 2012-12-19 DIAGNOSIS — M25531 Pain in right wrist: Secondary | ICD-10-CM

## 2012-12-19 NOTE — ED Notes (Signed)
PLAN OF CARE  TIMELINE  DISCUSSED  WITH  PT    PT  SITTING  UPRIGHT ON  EXAM TABLE IN NO ACUTE  DISTRESS

## 2012-12-19 NOTE — ED Provider Notes (Signed)
Medical screening examination/treatment/procedure(s) were performed by a resident physician or non-physician practitioner and as the supervising physician I was immediately available for consultation/collaboration.  Zana Biancardi, MD   Amos Micheals S Arina Torry, MD 12/19/12 1356 

## 2012-12-19 NOTE — ED Notes (Signed)
Ortho Tech Paged 

## 2012-12-19 NOTE — ED Provider Notes (Signed)
History    CSN: [id number] Arrival date & time 12/19/12  0910  First MD Initiated Contact with Patient 12/19/12 [phone]     Chief Complaint  Patient presents with  . Hand Injury   (Consider location/radiation/quality/duration/timing/severity/associated sxs/prior Treatment) HPI  47 yo wf come in today with complaint of right elbow, wrist, forearm pain and right ue numbness, tingling.  States that on 06 December 2012 she was putting up a tent and fell on outstretched hands onto the pavement.  Immediate pain both wrists, forearm and right elbow.  Next day went to [facility] and xrays were read as negative for fracture.  Left arm ok now, but has continued to have marked discomfort in right elbow.  Cannot supinate or pronate forearm.  Pain radial wrist and mid forearm.  Denies neck pain or shoulder pain.  No other injuries.   Past Medical History  Diagnosis Date  . ASCUS (atypical squamous cells of undetermined significance) on Pap smear 04/2010    NEG HPV   Past Surgical History  Procedure Laterality Date  . Tubal ligation    . Augmentation mammaplasty     No family history on file. History  Substance Use Topics  . Smoking status: Never Smoker   . Smokeless tobacco: Never Used  . Alcohol Use: No     Comment: 1-3 WEEK   OB History   Grav Para Term Preterm Abortions TAB SAB Ect Mult Living   4 3   1     3      Review of Systems  Constitutional: Negative.   HENT: Negative.   Eyes: Negative.   Respiratory: Negative.   Cardiovascular: Negative.   Gastrointestinal: Negative.   Endocrine: Negative.   Genitourinary: Negative.   Musculoskeletal: Positive for joint swelling.  Skin: Negative.   Neurological: Negative.   Psychiatric/Behavioral: Negative.     Allergies  Review of patient's allergies indicates no known allergies.  Home Medications   Current Outpatient Rx  Name  Route  Sig  Dispense  Refill  . amphetamine-dextroamphetamine (ADDERALL) 20 MG tablet  Oral   Take 20 mg by mouth 3 (three) times daily.         Marland Kitchen ibuprofen (ADVIL,MOTRIN) 800 MG tablet   Oral   Take 800 mg by mouth 3 (three) times daily.          BP 109/80  Pulse 71  Temp(Src) 97.6 F (36.4 C) (Oral)  Resp 18  SpO2 100%  LMP 12/13/2012 Physical Exam  Constitutional: She is oriented to person, place, and time. She appears well-developed and well-nourished.  HENT:  Head: Normocephalic and atraumatic.  Eyes: EOM are normal. Pupils are equal, round, and reactive to light.  Neck: Normal range of motion.  No brachial plexus tenderness.   Pulmonary/Chest: Effort normal.  Musculoskeletal:  Right shoulder good rom.  Neg impingement test and drop arm.  Right elbow rom about 20-100 degrees with pain.  Cannot supinate or pronate due to pain.  Right wrist decreased rom.  Mod snuff box tenderness.  Pos tinel over right cubital and carpal tunnel.  Some swelling around right elbow.  Sensation intact.    Neurological: She is alert and oriented to person, place, and time.  Skin: Skin is warm and dry.  Psychiatric: She has a normal mood and affect.    ED Course  Procedures (including critical care time) Labs Reviewed - No data to display Dg Elbow Complete Right  12/19/2012   *RADIOLOGY REPORT*  Clinical  Data:   Hand injury.  RIGHT ELBOW - COMPLETE 3+ VIEW  Comparison: None.  Findings: There is a small joint effusion.  A nondisplaced radial head fracture is suspected. No dislocation.  No radiopaque foreign bodies or soft tissue calcifications identified.  No significant arthropathy.  IMPRESSION:  1.  Joint effusion. 2.  Suspect nondisplaced radial head fracture.   Original Report Authenticated By: Signa Kell, M.D.   Dg Forearm Right  12/19/2012   *RADIOLOGY REPORT*  Clinical Data: Fall 12/06/2012.  Elbow pain.  RIGHT FOREARM - 2 VIEW  Comparison: None  Findings: Fracture of the radial head without significant displacement again noted and similar to the prior study.  Joint  effusion is present.  No fracture of the ulna or humerus. See separate wrist report.  IMPRESSION: Radial head fracture extending into the joint with minimal displacement.   Original Report Authenticated By: Janeece Riggers, M.D.   Dg Wrist Complete Right  12/19/2012   **ADDENDUM** CREATED: 12/19/2012 10:40:35  There is cortical bulge in the lateral aspect of the scaphoid .  I cannot exclude a subtle nondisplaced fracture.  Clinical correlation is necessary.  I reviewed the films with Zonia Kief  **END ADDENDUM** SIGNED BY: Natasha Mead, M.D.  12/19/2012   *RADIOLOGY REPORT*  Clinical Data: Fall, lateral wrist pain  RIGHT WRIST - COMPLETE 3+ VIEW  Comparison: None.  Findings: Four views of the right wrist submitted.  No acute fracture or subluxation.  No radiopaque foreign body.  IMPRESSION: No acute fracture or subluxation.   Original Report Authenticated By: Natasha Mead, M.D.   1. Radial head fracture, closed, right, initial encounter   2. Wrist pain, acute, right     MDM  Patient put in a long arm posterior splint.  Will call dr Cline Cools office today to sched appt for tomorrow.  All questions answered.  No lifting with right arm.  xrays reviewed with patient and husband who is present.    Zonia Kief, PA-C 12/19/12 1239

## 2012-12-19 NOTE — ED Notes (Signed)
Pt  Reports      She  [place] Seat   July  4   Landed  On  Hands      -  She  Reports   That      She  Was  Seen  The  Next  Day  At [facility]  And  She  Says  She  Had  Xray  Of the  r  Wrist and  Forearm  Which  She  Says  Were   Negative   -  She  Reports  Now  Pain  Over  Most of r  Arm  With  Tingling  Sensation      As  Well  As  Bruising of r  Elbow     - she  Is  Sitting  Upright on    Exam table  Speaking in  Complete  sentances

## 2012-12-20 ENCOUNTER — Other Ambulatory Visit: Payer: Self-pay | Admitting: Orthopedic Surgery

## 2012-12-20 DIAGNOSIS — M25531 Pain in right wrist: Secondary | ICD-10-CM

## 2012-12-24 ENCOUNTER — Ambulatory Visit
Admission: RE | Admit: 2012-12-24 | Discharge: 2012-12-24 | Disposition: A | Discharge status: Home / Self Care | Payer: BC Managed Care – PPO | Source: Ambulatory Visit | Attending: Orthopedic Surgery | Admitting: Orthopedic Surgery

## 2012-12-24 DIAGNOSIS — M25531 Pain in right wrist: Secondary | ICD-10-CM

## 2013-01-10 ENCOUNTER — Ambulatory Visit: Payer: BC Managed Care – PPO | Admitting: Oncology

## 2013-01-10 ENCOUNTER — Other Ambulatory Visit: Payer: BC Managed Care – PPO | Admitting: Lab

## 2013-02-27 ENCOUNTER — Ambulatory Visit (INDEPENDENT_AMBULATORY_CARE_PROVIDER_SITE_OTHER): Payer: BC Managed Care – PPO | Admitting: Family Medicine

## 2013-02-27 ENCOUNTER — Encounter: Payer: Self-pay | Admitting: Family Medicine

## 2013-02-27 VITALS — BP 120/84 | Temp 98.6°F | Ht 63.0 in | Wt 178.0 lb

## 2013-02-27 DIAGNOSIS — Z7689 Persons encountering health services in other specified circumstances: Secondary | ICD-10-CM

## 2013-02-27 DIAGNOSIS — Z87898 Personal history of other specified conditions: Secondary | ICD-10-CM | POA: Insufficient documentation

## 2013-02-27 DIAGNOSIS — K219 Gastro-esophageal reflux disease without esophagitis: Secondary | ICD-10-CM

## 2013-02-27 DIAGNOSIS — E039 Hypothyroidism, unspecified: Secondary | ICD-10-CM

## 2013-02-27 DIAGNOSIS — Z8742 Personal history of other diseases of the female genital tract: Secondary | ICD-10-CM

## 2013-02-27 DIAGNOSIS — Z23 Encounter for immunization: Secondary | ICD-10-CM

## 2013-02-27 DIAGNOSIS — Z7189 Other specified counseling: Secondary | ICD-10-CM

## 2013-02-27 DIAGNOSIS — F988 Other specified behavioral and emotional disorders with onset usually occurring in childhood and adolescence: Secondary | ICD-10-CM | POA: Insufficient documentation

## 2013-02-27 LAB — TSH: TSH: 5.71 u[IU]/mL — ABNORMAL HIGH (ref 0.35–5.50)

## 2013-02-27 LAB — LIPID PANEL: HDL: 58.7 mg/dL (ref >39.00)

## 2013-02-27 LAB — LDL CHOLESTEROL, DIRECT: Direct LDL: 136.4 mg/dL

## 2013-02-27 LAB — HEMOGLOBIN A1C: Hgb A1c MFr Bld: 5.7 % (ref 4.6–6.5)

## 2013-02-27 LAB — T4, FREE: Free T4: 0.56 ng/dL — ABNORMAL LOW (ref 0.60–1.60)

## 2013-02-27 NOTE — Progress Notes (Signed)
Chief Complaint  Patient presents with  . Establish Care    HPI:  Joy Wilson is here to establish care. Recently moved back to [place]. Last PCP and physical: 2012 - had pap and was normal - hx remotely abnormal. Hx of mild hypothyroidism on synthroid for 10 years, then was treated by holistic doctor and thyroid studies normalized off of synthroid. Has been off of synthroid for 2 years now.   Has the following chronic problems and concerns today:  Hx of Hypothyroidism: -feels fine -off synthroid for 2 years, used to see Dr. Lucianne Muss for years and would like to see him again if needs to be treated ago -has gained some weight, but has been eating unhealthy and not exercising -no hot/cold intol, abn fatigue, skin changes, palpitations  Patient Active Problem List   Diagnosis Date Noted  . ADD (attention deficit disorder) - treated by Dr. Evelene Croon 02/27/2013  . Unspecified hypothyroidism 02/27/2013  . GERD (gastroesophageal reflux disease) 02/27/2013  . History of abnormal Pap smear 02/27/2013    Health Maintenance: -got flu vaccine today  ROS: See pertinent positives and negatives per HPI.  Past Medical History  Diagnosis Date  . ASCUS (atypical squamous cells of undetermined significance) on Pap smear 04/2010    NEG HPV  . GERD (gastroesophageal reflux disease)   . Hypothyroid     Family History  Problem Relation Age of Onset  . Family history unknown: Yes    History   Social History  . Marital Status: Married    Spouse Name: N/A    Number of Children: N/A  . Years of Education: N/A   Social History Main Topics  . Smoking status: Never Smoker   . Smokeless tobacco: Never Used  . Alcohol Use: No     Comment: 1-3 WEEK  . Drug Use: No  . Sexual Activity: Yes    Birth Control/ Protection: Surgical   Other Topics Concern  . None   Social History Narrative   Work or School: homemaker      Home Situation: lives with husband      Spiritual Beliefs: none       Lifestyle: no regular exercise; diet poor             Current outpatient prescriptions:amphetamine-dextroamphetamine (ADDERALL) 20 MG tablet, Take 20 mg by mouth 3 (three) times daily., Disp: , Rfl: ;  BIOTIN PO, Take by mouth., Disp: , Rfl: ;  ibuprofen (ADVIL,MOTRIN) 800 MG tablet, Take 800 mg by mouth 3 (three) times daily., Disp: , Rfl:   EXAM:  Filed Vitals:   02/27/13 1417  BP: 120/84  Temp: 98.6 F (37 C)    Body mass index is 31.54 kg/(m^2).  GENERAL: vitals reviewed and listed above, alert, oriented, appears well hydrated and in no acute distress  HEENT: atraumatic, conjunttiva clear, no obvious abnormalities on inspection of external nose and ears  NECK: no obvious masses on inspection  LUNGS: clear to auscultation bilaterally, no wheezes, rales or rhonchi, good air movement  CV: HRRR, no peripheral edema  MS: moves all extremities without noticeable abnormality  PSYCH: pleasant and cooperative, no obvious depression or anxiety  ASSESSMENT AND PLAN:  Discussed the following assessment and plan:  ADD (attention deficit disorder) - treated by Dr. Evelene Croon  Need for prophylactic vaccination and inoculation against influenza - Plan: Flu Vaccine QUAD 36+ mos PF IM (Fluarix)  Unspecified hypothyroidism - Plan: TSH, T4, Free  GERD (gastroesophageal reflux disease)  History of abnormal Pap smear  Encounter to establish care - Plan: Lipid Panel, Hemoglobin A1c  -We reviewed the PMH, PSH, FH, SH, Meds and Allergies. -We provided refills for any medications we will prescribe as needed. -We addressed current concerns per orders and patient instructions. -We have asked for records for pertinent exams, studies, vaccines and notes from previous providers. -We have advised patient to follow up per instructions below. -NON-FASTING LABS today -she wants to see Dr. Lucianne Muss if needs synthroid again -advised needs to recheck pap given hx abnormal pap and she will schedule  physical with pap -flu vaccine given today   -Patient advised to return or notify a doctor immediately if symptoms worsen or persist or new concerns arise.  Patient Instructions  -We have ordered labs or studies at this visit. It can take up to 1-2 weeks for results and processing. We will contact you with instructions IF your results are abnormal. Normal results will be released to your [facility]. If you have not heard from [place] or can not find your results in [facility] in 2 weeks please contact our office.  -PLEASE SIGN UP FOR MYCHART TODAY   We recommend the following healthy lifestyle measures: - eat a healthy diet consisting of lots of vegetables, fruits, beans, nuts, seeds, healthy meats such as white chicken and fish and whole grains.  - avoid fried foods, fast food, processed foods, sodas, red meet and other fattening foods.  - get a least 150 minutes of aerobic exercise per week.   Schedule your mammogram  Follow up in: the end of November or December for you physical with pap      Kriste Basque R.

## 2013-02-27 NOTE — Patient Instructions (Signed)
-  We have ordered labs or studies at this visit. It can take up to 1-2 weeks for results and processing. We will contact you with instructions IF your results are abnormal. Normal results will be released to your [facility]. If you have not heard from [place] or can not find your results in [facility] in 2 weeks please contact our office.  -PLEASE SIGN UP FOR MYCHART TODAY   We recommend the following healthy lifestyle measures: - eat a healthy diet consisting of lots of vegetables, fruits, beans, nuts, seeds, healthy meats such as white chicken and fish and whole grains.  - avoid fried foods, fast food, processed foods, sodas, red meet and other fattening foods.  - get a least 150 minutes of aerobic exercise per week.   Schedule your mammogram  Follow up in: the end of November or December for you physical with pap

## 2013-02-28 MED ORDER — LEVOTHYROXINE SODIUM 25 MCG PO TABS: 25.0000 ug | ORAL_TABLET | Freq: Every day | ORAL | Status: DC

## 2013-02-28 NOTE — Progress Notes (Signed)
Quick Note:  Called and spoke with pt and pt is aware. rx sent to pharmacy. ______

## 2013-02-28 NOTE — Addendum Note (Signed)
Addended by: Azucena Freed on: 02/28/2013 04:15 PM   Modules accepted: Orders

## 2013-03-03 ENCOUNTER — Encounter (HOSPITAL_COMMUNITY): Payer: Self-pay | Admitting: Nurse Practitioner

## 2013-03-03 ENCOUNTER — Telehealth: Payer: Self-pay | Admitting: Family Medicine

## 2013-03-03 ENCOUNTER — Emergency Department (HOSPITAL_COMMUNITY)
Admission: EM | Admit: 2013-03-03 | Discharge: 2013-03-03 | Discharge status: Against Medical Advice | Payer: BC Managed Care – PPO | Attending: Emergency Medicine | Admitting: Emergency Medicine

## 2013-03-03 DIAGNOSIS — K3189 Other diseases of stomach and duodenum: Secondary | ICD-10-CM | POA: Insufficient documentation

## 2013-03-03 DIAGNOSIS — K219 Gastro-esophageal reflux disease without esophagitis: Secondary | ICD-10-CM | POA: Insufficient documentation

## 2013-03-03 DIAGNOSIS — M549 Dorsalgia, unspecified: Secondary | ICD-10-CM | POA: Insufficient documentation

## 2013-03-03 DIAGNOSIS — K5900 Constipation, unspecified: Secondary | ICD-10-CM | POA: Insufficient documentation

## 2013-03-03 DIAGNOSIS — R1011 Right upper quadrant pain: Secondary | ICD-10-CM | POA: Insufficient documentation

## 2013-03-03 DIAGNOSIS — Z8742 Personal history of other diseases of the female genital tract: Secondary | ICD-10-CM | POA: Insufficient documentation

## 2013-03-03 DIAGNOSIS — E039 Hypothyroidism, unspecified: Secondary | ICD-10-CM | POA: Insufficient documentation

## 2013-03-03 LAB — CBC WITH DIFFERENTIAL/PLATELET
Basophils Relative: 1 % (ref 0–1)
Eosinophils Absolute: 0.2 10*3/uL (ref 0.0–0.7)
Eosinophils Relative: 2 % (ref 0–5)
HCT: 39.1 % (ref 36.0–46.0)
Hemoglobin: 13.2 g/dL (ref 12.0–15.0)
Lymphocytes Relative: 30 % (ref 12–46)
Lymphs Abs: 2.6 10*3/uL (ref 0.7–4.0)
MCH: 29.3 pg (ref 26.0–34.0)
MCV: 86.9 fL (ref 78.0–100.0)
Monocytes Relative: 9 % (ref 3–12)
Platelets: 294 10*3/uL (ref 150–400)
RBC: 4.5 MIL/uL (ref 3.87–5.11)
WBC: 8.6 10*3/uL (ref 4.0–10.5)

## 2013-03-03 LAB — COMPREHENSIVE METABOLIC PANEL
ALT: 12 U/L (ref 0–35)
AST: 16 U/L (ref 0–37)
Albumin: 4 g/dL (ref 3.5–5.2)
Alkaline Phosphatase: 105 U/L (ref 39–117)
BUN: 12 mg/dL (ref 6–23)
CO2: 23 mEq/L (ref 19–32)
Calcium: 9.2 mg/dL (ref 8.4–10.5)
Creatinine, Ser: 0.79 mg/dL (ref 0.50–1.10)
GFR calc Af Amer: >90 mL/min (ref >90)
Glucose, Bld: 94 mg/dL (ref 70–99)
Sodium: 140 mEq/L (ref 135–145)
Total Bilirubin: 0.5 mg/dL (ref 0.3–1.2)
Total Protein: 8.1 g/dL (ref 6.0–8.3)

## 2013-03-03 NOTE — ED Notes (Signed)
Pt saw PCP earlier this week for severe indigestion, especially at night, and constipation. States she continues to have indigestion and now is having RUQ abd pain radiating into R side/mid back. PCP told pt she should come to er tonight "to have gallbladder checked."

## 2013-03-03 NOTE — ED Notes (Signed)
Pt called from waiting room with no answer 

## 2013-03-03 NOTE — Telephone Encounter (Signed)
Patient Information:  Caller Name: Shawntia  Phone: [phone]  Patient: Joy Wilson, Joy Wilson  Gender: Female  DOB: [birth year]  Age: 47 Years  PCP: Selena Batten (TEXT 1st, after 20 mins can call), [facility]ia Client [facility])  Pregnant: No  Office Follow Up:  Does the office need to follow up with this patient?: No  Instructions For The Office: N/A  RN Note:  Tamp 99 temporal at 1530; last 800 mg Ibuprofen dose at 1400. Severe, constant right flank pain present since 1200 with constant indigestion and flatulance.  Symptoms  Reason For Call & Symptoms: Concerned about gallbladder. Reports severe, constant right flank/back pain worse with movement or deep breath since noon.  Continues to have "indigestion" for the past week followed by gas pain 02/28/13; now has constant pain in right upper quadrant of abdomen made worse with movement, deep pain or palpation and right sided mid-back pain.  Reviewed Health History In EMR: Yes  Reviewed Medications In EMR: Yes  Reviewed Allergies In EMR: Yes  Reviewed Surgeries / Procedures: Yes  Date of Onset of Symptoms: 02/24/2013  Treatments Tried: Ibuprofen, Miralax  Treatments Tried Worked: No OB / GYN:  LMP: 02/03/2013  Guideline(s) Used:  Flank Pain  Disposition Per Guideline:   Go to ED Now  Reason For Disposition Reached:   Severe pain (e.g., excruciating, scale 8-10) and present > 1 hour  Advice Given:  Call Back If:  You become worse.  Patient Will Follow Care Advice:  YES

## 2013-03-04 ENCOUNTER — Emergency Department (HOSPITAL_COMMUNITY): Payer: BC Managed Care – PPO

## 2013-03-04 ENCOUNTER — Encounter (HOSPITAL_COMMUNITY): Payer: Self-pay

## 2013-03-04 ENCOUNTER — Emergency Department (HOSPITAL_COMMUNITY)
Admission: EM | Admit: 2013-03-04 | Discharge: 2013-03-04 | Disposition: A | Discharge status: Home / Self Care | Payer: BC Managed Care – PPO | Attending: Emergency Medicine | Admitting: Emergency Medicine

## 2013-03-04 DIAGNOSIS — Z8639 Personal history of other endocrine, nutritional and metabolic disease: Secondary | ICD-10-CM | POA: Insufficient documentation

## 2013-03-04 DIAGNOSIS — Z3202 Encounter for pregnancy test, result negative: Secondary | ICD-10-CM | POA: Insufficient documentation

## 2013-03-04 DIAGNOSIS — A599 Trichomoniasis, unspecified: Secondary | ICD-10-CM | POA: Insufficient documentation

## 2013-03-04 DIAGNOSIS — Z79899 Other long term (current) drug therapy: Secondary | ICD-10-CM | POA: Insufficient documentation

## 2013-03-04 DIAGNOSIS — Z8719 Personal history of other diseases of the digestive system: Secondary | ICD-10-CM | POA: Insufficient documentation

## 2013-03-04 DIAGNOSIS — Z862 Personal history of diseases of the blood and blood-forming organs and certain disorders involving the immune mechanism: Secondary | ICD-10-CM | POA: Insufficient documentation

## 2013-03-04 DIAGNOSIS — R1011 Right upper quadrant pain: Secondary | ICD-10-CM | POA: Insufficient documentation

## 2013-03-04 LAB — HEPATIC FUNCTION PANEL
ALT: 11 U/L (ref 0–35)
AST: 16 U/L (ref 0–37)
Alkaline Phosphatase: 92 U/L (ref 39–117)
Bilirubin, Direct: 0.1 mg/dL (ref 0.0–0.3)
Indirect Bilirubin: 0.4 mg/dL (ref 0.3–0.9)
Total Bilirubin: 0.5 mg/dL (ref 0.3–1.2)
Total Protein: 7.3 g/dL (ref 6.0–8.3)

## 2013-03-04 LAB — BASIC METABOLIC PANEL
CO2: 21 mEq/L (ref 19–32)
Calcium: 9 mg/dL (ref 8.4–10.5)
Chloride: 104 mEq/L (ref 96–112)
Creatinine, Ser: 0.88 mg/dL (ref 0.50–1.10)
GFR calc Af Amer: 90 mL/min — ABNORMAL LOW (ref >90)
GFR calc non Af Amer: 78 mL/min — ABNORMAL LOW (ref >90)
Glucose, Bld: 84 mg/dL (ref 70–99)
Sodium: 138 mEq/L (ref 135–145)

## 2013-03-04 LAB — PREGNANCY, URINE: Preg Test, Ur: NEGATIVE

## 2013-03-04 LAB — CBC WITH DIFFERENTIAL/PLATELET
Basophils Absolute: 0 10*3/uL (ref 0.0–0.1)
Basophils Relative: 0 % (ref 0–1)
Eosinophils Absolute: 0.1 10*3/uL (ref 0.0–0.7)
Eosinophils Relative: 2 % (ref 0–5)
HCT: 37.2 % (ref 36.0–46.0)
Lymphocytes Relative: 24 % (ref 12–46)
Lymphs Abs: 1.4 10*3/uL (ref 0.7–4.0)
MCH: 30.4 pg (ref 26.0–34.0)
MCV: 87.1 fL (ref 78.0–100.0)
Monocytes Absolute: 0.5 10*3/uL (ref 0.1–1.0)
Neutro Abs: 3.9 10*3/uL (ref 1.7–7.7)
Neutrophils Relative %: 65 % (ref 43–77)
Platelets: 272 10*3/uL (ref 150–400)
RDW: 12.8 % (ref 11.5–15.5)
WBC: 5.9 10*3/uL (ref 4.0–10.5)

## 2013-03-04 LAB — URINALYSIS, ROUTINE W REFLEX MICROSCOPIC
Bilirubin Urine: NEGATIVE
Glucose, UA: NEGATIVE mg/dL
Hgb urine dipstick: NEGATIVE
Ketones, ur: NEGATIVE mg/dL
Protein, ur: NEGATIVE mg/dL
pH: 7 (ref 5.0–8.0)

## 2013-03-04 LAB — URINE MICROSCOPIC-ADD ON

## 2013-03-04 MED ORDER — GI COCKTAIL ~~LOC~~
30.0000 mL | ORAL | Status: DC | PRN
  Administered 2013-03-04: 30 mL via ORAL
  Filled 2013-03-04: qty 30

## 2013-03-04 MED ORDER — METRONIDAZOLE 500 MG PO TABS
2000.0000 mg | ORAL_TABLET | Freq: Once | ORAL | Status: AC
  Administered 2013-03-04: 2000 mg via ORAL
  Filled 2013-03-04: qty 4

## 2013-03-04 NOTE — ED Provider Notes (Signed)
Medical screening examination/treatment/procedure(s) were performed by non-physician practitioner and as supervising physician I was immediately available for consultation/collaboration.  Doug Sou, MD 03/04/13 [phone]

## 2013-03-04 NOTE — ED Notes (Signed)
Pt given medication for indigestion. Also given water to obtain urine specimen.

## 2013-03-04 NOTE — ED Notes (Signed)
Pt complains of indegestion, back and right shoulder pain and constipation, pt reports her md told her to come and have gall bladder checked.

## 2013-03-04 NOTE — ED Notes (Signed)
Contacted lab regarding status of d-dimer, states it is in machine, should result soon.

## 2013-03-04 NOTE — ED Notes (Addendum)
Pt states she just went to restroom, was unaware of urine specimen needed. Will obtain if goes again. States does not need medication right now.

## 2013-03-04 NOTE — ED Provider Notes (Signed)
CSN: [id number]     Arrival date & time 03/04/13  4098 History   First MD Initiated Contact with Patient 03/04/13 [phone]     Chief Complaint  Patient presents with  . Abdominal Pain   (Consider location/radiation/quality/duration/timing/severity/associated sxs/prior Treatment) HPI Comments: Patient is a 47 year old female with a past medical history of GERD and hypothyroidism who presents with a 3 day history of abdominal pain. Symptoms started gradually and progressively worsened since the onset. The pain is located RUQ and radiates into her right chest and right shoulder. The pain is described as aching and severe. No alleviating/aggravating factors. The patient has tried prilosec for symptoms without relief. Associated symptoms include nothing. Patient denies fever, headache, NVD, chest pain, SOB, dysuria, constipation.      Past Medical History  Diagnosis Date  . ASCUS (atypical squamous cells of undetermined significance) on Pap smear 04/2010    NEG HPV  . GERD (gastroesophageal reflux disease)   . Hypothyroid    Past Surgical History  Procedure Laterality Date  . Tubal ligation    . Augmentation mammaplasty     History reviewed. No pertinent family history. History  Substance Use Topics  . Smoking status: Never Smoker   . Smokeless tobacco: Never Used  . Alcohol Use: No     Comment: 1-3 WEEK   OB History   Grav Para Term Preterm Abortions TAB SAB Ect Mult Living   4 3   1     3      Review of Systems  Gastrointestinal: Positive for abdominal pain.  All other systems reviewed and are negative.    Allergies  Review of patient's allergies indicates no known allergies.  Home Medications   Current Outpatient Rx  Name  Route  Sig  Dispense  Refill  . amphetamine-dextroamphetamine (ADDERALL) 20 MG tablet   Oral   Take 20 mg by mouth 2 (two) times daily.          Marland Kitchen BIOTIN PO   Oral   Take 1 tablet by mouth daily.          Marland Kitchen ibuprofen (ADVIL,MOTRIN) 800 MG  tablet   Oral   Take 800 mg by mouth every 8 (eight) hours as needed for pain.           BP 116/78  Pulse 75  Temp(Src) 98 F (36.7 C) (Oral)  Resp 18  SpO2 98%  LMP 02/04/2013 Physical Exam  Nursing note and vitals reviewed. Constitutional: She is oriented to person, place, and time. She appears well-developed and well-nourished. No distress.  HENT:  Head: Normocephalic and atraumatic.  Eyes: Conjunctivae and EOM are normal. Pupils are equal, round, and reactive to light. No scleral icterus.  Neck: Normal range of motion.  Cardiovascular: Normal rate and regular rhythm.  Exam reveals no gallop and no friction rub.   No murmur heard. Pulmonary/Chest: Effort normal and breath sounds normal. She has no wheezes. She has no rales. She exhibits no tenderness.  Abdominal: Soft. She exhibits no distension. There is tenderness. There is no rebound and no guarding.  Positive Murphy's signs. RUQ tenderness to palpation. No other focal tenderness to palpation. No peritoneal signs.   Musculoskeletal: Normal range of motion.  Neurological: She is alert and oriented to person, place, and time. Coordination normal.  Speech is goal-oriented. Moves limbs without ataxia.   Skin: Skin is warm and dry.  Psychiatric: She has a normal mood and affect. Her behavior is normal.    ED  Course  Procedures (including critical care time)   Date: 03/04/2013  Rate: 67  Rhythm: normal sinus rhythm  QRS Axis: normal  Intervals: normal  ST/T Wave abnormalities: normal  Conduction Disutrbances:none  Narrative Interpretation: NSR without previous for comparison  Old EKG Reviewed: none available   Labs Review Labs Reviewed  BASIC METABOLIC PANEL - Abnormal; Notable for the following:    GFR calc non Af Amer 78 (*)    GFR calc Af Amer 90 (*)    All other components within normal limits  URINALYSIS, ROUTINE W REFLEX MICROSCOPIC - Abnormal; Notable for the following:    Urobilinogen, UA 2.0 (*)     Leukocytes, UA SMALL (*)    All other components within normal limits  URINE MICROSCOPIC-ADD ON - Abnormal; Notable for the following:    Bacteria, UA MANY (*)    All other components within normal limits  URINE CULTURE  CBC WITH DIFFERENTIAL  LIPASE, BLOOD  PREGNANCY, URINE  HEPATIC FUNCTION PANEL  D-DIMER, QUANTITATIVE  POCT I-STAT TROPONIN I   Imaging Review US Abdomen Complete  03/04/2013   CLINICAL DATA:  History of abdominal pain.  EXAM: ULTRASOUND ABDOMEN COMPLETE  COMPARISON:  CT 01/27/2011.  FINDINGS: Gallbladder  No gallstones or wall thickening. Negative sonographic Murphy's sign. Gallbladder wall thickness measured 1.8 mm. No pericholecystic fluid evident.  Common bile duct  Diameter: Common bile duct measured 4.7 mm. No choledocholithiasis is evident.  Liver  No focal lesion identified. Within normal limits in parenchymal echogenicity. Portal vein is patent with hepatopetal flow.  IVC  No abnormality visualized.  Pancreas  Visualized portion unremarkable.  Spleen  Size and appearance within normal limits.  Splenic length is 9 cm.  Right Kidney  Length: Right renal length is 10.6 cm. Echogenicity within normal limits. No mass or hydronephrosis visualized.  Left Kidney  Length: Left renal length is 10.1 cm. Echogenicity within normal limits. No mass or hydronephrosis visualized.  Abdominal aorta  No aneurysm visualized.  Maximum diameter is 2.2 cm.  IMPRESSION: No abdominal pathology was identified.   Electronically Signed   By: Onalee Hua  Call M.D.   On: 03/04/2013 09:53    MDM   1. RUQ abdominal pain   2. Trichomonas     9:08 AM Labs pending. Patient declines pain medication at this time. Vitals stable and patient afebrile. Patient will have RUQ [place] to evaluate gallbladder.   2:15 PM [place] and labs unremarkable. Patient has trichomonas in her urine as incidental finding. Patient will be treated for trichomonas here. D-dimer negative. Patient will be referred to Gastroenterology for  further evaluation. Vitals stable and patient afebrile.     Emilia Beck, PA-C 03/04/13 1422

## 2013-03-05 LAB — URINE CULTURE: Special Requests: NORMAL

## 2013-03-06 NOTE — ED Notes (Signed)
Patient informed of positive results and requests that rx be called to  [facility] [phone]

## 2013-03-06 NOTE — ED Notes (Signed)
Post ED Visit - Positive Culture Follow-up: Successful Patient Follow-Up  Culture assessed and recommendations reviewed by: []  Wes Dulaney, Pharm.D., BCPS []  Celedonio Miyamoto, Pharm.D., BCPS []  Georgina Pillion, [address].D., BCPS []  [place], [address].D., BCPS, AAHIVP [x]  Estella Husk, Pharm.D., BCPS, AAHIVP  Positive urine culture X] Patient discharged originally without antimicrobial agent and treatment is now indicated  New antibiotic prescription: Keflex 500mg  PO TID x 5 days  ED Provider: Marlon Pel, PA-C     Larena Sox 03/06/2013, 2:43 PM

## 2013-03-06 NOTE — Progress Notes (Signed)
ED Antimicrobial Stewardship Positive Culture Follow Up   [name] is an 47 y.o. female who presented to [facility] on 03/04/2013 with a chief complaint of  Chief Complaint  Patient presents with  . Abdominal Pain    Recent Results (from the past 720 hour(s))  URINE CULTURE     Status: None   Collection Time    03/04/13 11:25 AM      Result Value Range Status   Specimen Description URINE, CLEAN CATCH   Final   Special Requests Normal   Final   Culture  Setup Time     Final   Value: 03/04/2013 17:26     Performed at [facility] Count     Final   Value: >=100,000 COLONIES/ML     Performed at [facility]   Culture     Final   Value: ESCHERICHIA COLI     Performed at [facility]   Report Status 03/05/2013 FINAL   Final   Organism ID, Bacteria ESCHERICHIA COLI   Final    [x]  Patient discharged originally without antimicrobial agent and treatment is now indicated  New antibiotic prescription: Keflex 500mg  PO TID x 5 days  ED Provider: Marlon Pel, Alroy Bailiff 03/06/2013, 12:05 PM Infectious Diseases Pharmacist Phone# [phone]

## 2013-05-03 ENCOUNTER — Encounter (HOSPITAL_COMMUNITY): Payer: Self-pay | Admitting: Emergency Medicine

## 2013-05-03 ENCOUNTER — Emergency Department (INDEPENDENT_AMBULATORY_CARE_PROVIDER_SITE_OTHER)
Admission: EM | Admit: 2013-05-03 | Discharge: 2013-05-03 | Disposition: A | Discharge status: Home / Self Care | Payer: BC Managed Care – PPO | Source: Home / Self Care | Attending: Family Medicine | Admitting: Family Medicine

## 2013-05-03 DIAGNOSIS — J209 Acute bronchitis, unspecified: Secondary | ICD-10-CM

## 2013-05-03 DIAGNOSIS — J208 Acute bronchitis due to other specified organisms: Secondary | ICD-10-CM

## 2013-05-03 MED ORDER — PREDNISONE 20 MG PO TABS: 40.0000 mg | ORAL_TABLET | Freq: Every day | ORAL | Status: DC

## 2013-05-03 MED ORDER — IPRATROPIUM BROMIDE 0.02 % IN SOLN
0.5000 mg | Freq: Once | RESPIRATORY_TRACT | Status: AC
  Administered 2013-05-03: 0.5 mg via RESPIRATORY_TRACT

## 2013-05-03 MED ORDER — IPRATROPIUM BROMIDE 0.06 % NA SOLN: 2.0000 | Freq: Four times a day (QID) | NASAL | Status: DC

## 2013-05-03 MED ORDER — ALBUTEROL SULFATE (5 MG/ML) 0.5% IN NEBU
5.0000 mg | INHALATION_SOLUTION | Freq: Once | RESPIRATORY_TRACT | Status: AC
  Administered 2013-05-03: 5 mg via RESPIRATORY_TRACT

## 2013-05-03 MED ORDER — IPRATROPIUM BROMIDE 0.02 % IN SOLN
RESPIRATORY_TRACT | Status: AC
  Filled 2013-05-03: qty 2.5

## 2013-05-03 MED ORDER — ALBUTEROL SULFATE (5 MG/ML) 0.5% IN NEBU
INHALATION_SOLUTION | RESPIRATORY_TRACT | Status: AC
  Filled 2013-05-03: qty 1

## 2013-05-03 MED ORDER — SODIUM CHLORIDE 0.9 % IN NEBU
INHALATION_SOLUTION | RESPIRATORY_TRACT | Status: AC
  Filled 2013-05-03: qty 6

## 2013-05-03 MED ORDER — AZITHROMYCIN 250 MG PO TABS: 250.0000 mg | ORAL_TABLET | Freq: Every day | ORAL | Status: DC

## 2013-05-03 MED ORDER — GUAIFENESIN-CODEINE 100-10 MG/5ML PO SOLN: 5.0000 mL | Freq: Every evening | ORAL | Status: DC | PRN

## 2013-05-03 NOTE — ED Provider Notes (Signed)
Joy Wilson is a 47 y.o. female who presents to Urgent Care today for 5 days of nasal discharge congestion cough and some mild subjective shortness of breath. She denies any nausea vomiting or diarrhea. She has remote history of asthma she was a child. No fevers or chills. Husband was recently diagnosed with bronchitis. She feels well otherwise. She's tried over-the-counter medications which have not been very helpful. The cough is productive   Past Medical History  Diagnosis Date  . ASCUS (atypical squamous cells of undetermined significance) on Pap smear 04/2010    NEG HPV  . GERD (gastroesophageal reflux disease)   . Hypothyroid    History  Substance Use Topics  . Smoking status: Never Smoker   . Smokeless tobacco: Never Used  . Alcohol Use: No     Comment: 1-3 WEEK   ROS as above Medications reviewed. No current facility-administered medications for this encounter.   Current Outpatient Prescriptions  Medication Sig Dispense Refill  . amphetamine-dextroamphetamine (ADDERALL) 20 MG tablet Take 20 mg by mouth 2 (two) times daily.       . Levothyroxine Sodium (SYNTHROID PO) Take by mouth.      Marland Kitchen azithromycin (ZITHROMAX) 250 MG tablet Take 1 tablet (250 mg total) by mouth daily. Take first 2 tablets together, then 1 every day until finished.  6 tablet  0  . BIOTIN PO Take 1 tablet by mouth daily.       Marland Kitchen guaiFENesin-codeine 100-10 MG/5ML syrup Take 5 mLs by mouth at bedtime as needed for cough.  120 mL  0  . ibuprofen (ADVIL,MOTRIN) 800 MG tablet Take 800 mg by mouth every 8 (eight) hours as needed for pain.       Marland Kitchen ipratropium (ATROVENT) 0.06 % nasal spray Place 2 sprays into both nostrils 4 (four) times daily.  15 mL  1  . predniSONE (DELTASONE) 20 MG tablet Take 2 tablets (40 mg total) by mouth daily.  10 tablet  0    Exam:  BP 131/88  Pulse 96  Temp(Src) 98.5 F (36.9 C) (Oral)  Resp 16  SpO2 98%  LMP 04/30/2013 Gen: Well NAD HEENT: EOMI,  MMM, posterior pharynx with  cobblestoning. Tympanic membranes normal appearing bilaterally. Lungs: Normal work of breathing. CTABL Heart: RRR no MRG Abd: NABS, Soft. NT, ND Exts: Non edematous BL  LE, warm and well perfused.   Patient was given a DuoNeb nebulizer treatment and had no significant improvement in symptoms.   Assessment and Plan: 47 y.o. female with viral bronchitis. Plan to treat symptomatically with prednisone, Atrovent nasal spray, codeine containing cough medication. If not improved we'll use azithromycin. Follow up with primary care provider. Discussed warning signs or symptoms. Please see discharge instructions. Patient expresses understanding.      Rodolph Bong, MD 05/03/13 1440

## 2013-05-03 NOTE — ED Notes (Signed)
Pt c/o cold sxs onset 2 days Sxs include: congestion, dry cough, SOB Denies: f/v/n/d, wheezing Alert w/no signs of acute distress

## 2013-05-12 ENCOUNTER — Ambulatory Visit (INDEPENDENT_AMBULATORY_CARE_PROVIDER_SITE_OTHER): Payer: BC Managed Care – PPO

## 2013-05-12 DIAGNOSIS — E039 Hypothyroidism, unspecified: Secondary | ICD-10-CM

## 2013-05-19 ENCOUNTER — Other Ambulatory Visit: Payer: Self-pay

## 2013-05-19 ENCOUNTER — Encounter: Payer: Self-pay | Admitting: Family Medicine

## 2013-05-19 ENCOUNTER — Ambulatory Visit (INDEPENDENT_AMBULATORY_CARE_PROVIDER_SITE_OTHER): Payer: BC Managed Care – PPO | Admitting: Family Medicine

## 2013-05-19 VITALS — BP 100/76 | Temp 98.4°F | Wt 184.0 lb

## 2013-05-19 DIAGNOSIS — E785 Hyperlipidemia, unspecified: Secondary | ICD-10-CM

## 2013-05-19 DIAGNOSIS — Z87898 Personal history of other specified conditions: Secondary | ICD-10-CM

## 2013-05-19 DIAGNOSIS — E039 Hypothyroidism, unspecified: Secondary | ICD-10-CM

## 2013-05-19 DIAGNOSIS — Z8742 Personal history of other diseases of the female genital tract: Secondary | ICD-10-CM

## 2013-05-19 DIAGNOSIS — R7309 Other abnormal glucose: Secondary | ICD-10-CM

## 2013-05-19 DIAGNOSIS — R7303 Prediabetes: Secondary | ICD-10-CM

## 2013-05-19 DIAGNOSIS — Z0000 Encounter for general adult medical examination without abnormal findings: Secondary | ICD-10-CM

## 2013-05-19 DIAGNOSIS — Z1231 Encounter for screening mammogram for malignant neoplasm of breast: Secondary | ICD-10-CM

## 2013-05-19 NOTE — Progress Notes (Signed)
Chief Complaint  Patient presents with  . Annual Exam    HPI:  Hypothyroid: -seeing Dr. Lucianne Muss in 2 days for this   Here for CPE:  -Concerns today: none  -Diet: variety of foods, balance and well rounded, larger portion sizes  -Taking folic acid, vit D, calcium: no  -Exercise: no regular exercise  -Diabetes and Dyslipidemia Screening: done 02/2013 with mild dyslipidemia and mild prediabetes  -Hx of HTN: no  -Vaccines: UTD on flu for this year, tdap   -pap history: hx multiple abnormal paps  -FDLMP: nov 24th, normal and regular  -sexual activity: yes, female partner, no new partners  -wants STI testing: no  -FH breast, colon or ovarian ca: see FH -mammo is scheduled  -Alcohol, Tobacco, drug use: see social history  Review of Systems - Review of Systems  Constitutional: Negative for fever, weight loss and malaise/fatigue.  HENT: Negative for hearing loss.   Eyes: Negative for blurred vision and pain.  Respiratory: Negative for shortness of breath.   Cardiovascular: Negative for chest pain, palpitations and leg swelling.  Gastrointestinal: Negative for vomiting, diarrhea, blood in stool and melena.  Genitourinary: Negative for dysuria.  Musculoskeletal: Negative for falls.  Skin: Negative for rash.  Neurological: Negative for dizziness and sensory change.  Endo/Heme/Allergies: Does not bruise/bleed easily.  Psychiatric/Behavioral: Negative for depression. The patient is not nervous/anxious.      Past Medical History  Diagnosis Date  . ASCUS (atypical squamous cells of undetermined significance) on Pap smear 04/2010    NEG HPV  . GERD (gastroesophageal reflux disease)   . Hypothyroid     Past Surgical History  Procedure Laterality Date  . Tubal ligation    . Augmentation mammaplasty      No family history on file.  History   Social History  . Marital Status: Married    Spouse Name: N/A    Number of Children: N/A  . Years of Education: N/A    Social History Main Topics  . Smoking status: Never Smoker   . Smokeless tobacco: Never Used  . Alcohol Use: No     Comment: 1-3 WEEK  . Drug Use: No  . Sexual Activity: Yes    Birth Control/ Protection: Surgical   Other Topics Concern  . None   Social History Narrative   Work or School: homemaker      Home Situation: lives with husband      Spiritual Beliefs: none      Lifestyle: no regular exercise; diet poor             Current outpatient prescriptions:amphetamine-dextroamphetamine (ADDERALL) 20 MG tablet, Take 20 mg by mouth 2 (two) times daily. , Disp: , Rfl: ;  azithromycin (ZITHROMAX) 250 MG tablet, Take 1 tablet (250 mg total) by mouth daily. Take first 2 tablets together, then 1 every day until finished., Disp: 6 tablet, Rfl: 0;  BIOTIN PO, Take 1 tablet by mouth daily. , Disp: , Rfl:  guaiFENesin-codeine 100-10 MG/5ML syrup, Take 5 mLs by mouth at bedtime as needed for cough., Disp: 120 mL, Rfl: 0;  ibuprofen (ADVIL,MOTRIN) 800 MG tablet, Take 800 mg by mouth every 8 (eight) hours as needed for pain. , Disp: , Rfl: ;  ipratropium (ATROVENT) 0.06 % nasal spray, Place 2 sprays into both nostrils 4 (four) times daily., Disp: 15 mL, Rfl: 1;  Levothyroxine Sodium (SYNTHROID PO), Take by mouth., Disp: , Rfl:  predniSONE (DELTASONE) 20 MG tablet, Take 2 tablets (40 mg total) by mouth  daily., Disp: 10 tablet, Rfl: 0  EXAM:  Filed Vitals:   05/19/13 1109  BP: 100/76  Temp: 98.4 F (36.9 C)    GENERAL: vitals reviewed and listed below, alert, oriented, appears well hydrated and in no acute distress  HEENT: head atraumatic, PERRLA, normal appearance of eyes, ears, nose and mouth. moist mucus membranes.  NECK: supple, no masses or lymphadenopathy  LUNGS: clear to auscultation bilaterally, no rales, rhonchi or wheeze  CV: HRRR, no peripheral edema or cyanosis, normal pedal pulses  BREAST: normal appearance - no lesions or discharge, on palpation normal breast tissue  without any suspicious masses  ABDOMEN: bowel sounds normal, soft, non tender to palpation, no masses, no rebound or guarding  GU: deferred  RECTAL: refused  SKIN: no rash or abnormal lesions  MS: normal gait, moves all extremities normally  NEURO: CN II-XII grossly intact, normal muscle strength and sensation to light touch on extremities  PSYCH: normal affect, pleasant and cooperative  ASSESSMENT AND PLAN:  Discussed the following assessment and plan:  Visit for preventive health examination  History of abnormal Pap smear  Unspecified hypothyroidism  Prediabetes  Dyslipidemia  -Discussed and advised all [place] preventive services health task force level A and B recommendations for age, sex and risks.  -Advised at least 150 minutes of exercise per week and a healthy diet low in saturated fats and sweets and consisting of fresh fruits and vegetables, lean meats such as fish and white chicken and whole grains.  -to see Dr. Lucianne Muss for hypothyroidism  -she has questions about fertility and hx of many abnormal paps, so she will see gyn for this  -labs, studies and vaccines per orders this encounter  No orders of the defined types were placed in this encounter.    Patient Instructions  We recommend the following healthy lifestyle measures: - eat a healthy diet consisting of lots of vegetables, fruits, beans, nuts, seeds, healthy meats such as white chicken and fish and whole grains.  - avoid fried foods, fast food, processed foods, sodas, red meet and other fattening foods.  - get a least 150 minutes of aerobic exercise per week.   -Take Vitamin D3 1000 IU daily and 1200mg  calcium daily in diet and supplement  -Schedule appointment with gynecologist for pap and fertility issues  -follow up 4-6 months    Patient advised to return to clinic immediately if symptoms worsen or persist or new concerns.    Return in about 6 months (around 11/17/2013).  Joy Wilson  R.

## 2013-05-19 NOTE — Patient Instructions (Signed)
We recommend the following healthy lifestyle measures: - eat a healthy diet consisting of lots of vegetables, fruits, beans, nuts, seeds, healthy meats such as white chicken and fish and whole grains.  - avoid fried foods, fast food, processed foods, sodas, red meet and other fattening foods.  - get a least 150 minutes of aerobic exercise per week.   -Take Vitamin D3 1000 IU daily and 1200mg  calcium daily in diet and supplement  -Schedule appointment with gynecologist for pap and fertility issues  -follow up 4-6 months

## 2013-05-21 ENCOUNTER — Ambulatory Visit (INDEPENDENT_AMBULATORY_CARE_PROVIDER_SITE_OTHER): Payer: BC Managed Care – PPO | Admitting: Endocrinology

## 2013-05-21 ENCOUNTER — Encounter: Payer: Self-pay | Admitting: Endocrinology

## 2013-05-21 VITALS — BP 124/72 | HR 92 | Temp 98.1°F | Resp 12 | Ht 64.0 in | Wt 180.1 lb

## 2013-05-21 DIAGNOSIS — E7800 Pure hypercholesterolemia, unspecified: Secondary | ICD-10-CM

## 2013-05-21 DIAGNOSIS — R635 Abnormal weight gain: Secondary | ICD-10-CM

## 2013-05-21 DIAGNOSIS — E039 Hypothyroidism, unspecified: Secondary | ICD-10-CM

## 2013-05-21 MED ORDER — LEVOTHYROXINE SODIUM 125 MCG PO TABS: 125.0000 ug | ORAL_TABLET | Freq: Every day | ORAL | Status: DC

## 2013-05-21 NOTE — Progress Notes (Signed)
Patient ID: Joy Wilson, female   DOB: 1966-05-20, 47 y.o.   MRN: [id number]  Reason for Appointment:  Hypothyroidism, new visit   History of Present Illness:   The Hyothyroidism was first diagnosed in 2002 At that time  she had noticed a rapid weight gain of about 30 pounds in 2 months. Records are not available of her evaluation.  She was given a thyroid supplement and she took them for 3-4 years with only modest benefit. She says that her weight did not consistently come down and she did not feel any better otherwise. She stopped the medication for 2-3 years on her own without experiencing symptoms. In 2008 when she was admitted for syncope she had significant hypothyroidism diagnosed and started taking Synthroid, initially 88 mcg and in 2012 this was increased to 125 mcg  In the last 2 years she had been seeing a holistic physician who was given her unknown supplements and a weight loss program. Apparently her thyroid supplements were weaned off and she had been off her Synthroid for about 6 months  The symptoms consistent with hypothyroidism  at this time are: fatigue at times, cold hands, some dry skin, weight gain She has no cold intolerance, constipation, new hair loss or difficulty concentrating  Because of her labs showing hypothyroidism in 9/14 she was started on 25 mcg of levothyroxine by her PCP and she is now here for followup        Lab Results  Component Value Date   FREET4 0.56* 02/27/2013   TSH 24.38* 05/12/2013   TSH 5.71* 02/27/2013     Past Medical History  Diagnosis Date  . ASCUS (atypical squamous cells of undetermined significance) on Pap smear 04/2010    NEG HPV  . GERD (gastroesophageal reflux disease)   . Hypothyroid     Past Surgical History  Procedure Laterality Date  . Tubal ligation    . Augmentation mammaplasty      History reviewed. No pertinent family history.  Social History:  reports that she has never smoked. She has never used smokeless  tobacco. She reports that she does not drink alcohol or use illicit drugs.  Allergies: No Known Allergies    Medication List       This list is accurate as of: 05/21/13 10:35 AM.  Always use your most recent med list.               amphetamine-dextroamphetamine 20 MG tablet  Commonly known as:  ADDERALL  Take 20 mg by mouth 2 (two) times daily.     BIOTIN PO  Take 1 tablet by mouth daily.     ibuprofen 800 MG tablet  Commonly known as:  ADVIL,MOTRIN  Take 800 mg by mouth every 8 (eight) hours as needed for pain.     SYNTHROID PO  Take 25 mcg by mouth.        Review of Systems:  No headaches She was told to watch her glucose although the last few readings are quite normal CARDIOLOGY: no history of high blood pressure.            GASTROENTEROLOGY:  no Change in bowel habits.      No swelling of her legs       She has regular menstrual cycles She has mild hypercholesterolemia, last LDL 134   Examination:    BP 124/72  Pulse 92  Temp(Src) 98.1 F (36.7 C)  Resp 12  Ht 5\' 4"  (1.626 m)  Wt 180  lb 1.6 oz (81.693 kg)  BMI 30.90 kg/m2  SpO2 98%  LMP 04/30/2013   General Appearance: pleasant, well-built and nourished, generalized obesity present         Eyes: No proptosis or eyelid swelling .          Neck: The thyroid is nonpalpable  There is no lymphadenopathy .    Cardiovascular: Normal  heart sounds, no murmur    Neurological: REFLEXES: at biceps are 1+ and are normal at the ankles with  normal relaxation    Skin: moist, warm, no rash        Assessments    Hypothyroidism, primary and long-standing. She may possibly have a component of secondary hypothyroidism since her free T4 was low in 9/14 despite only mild increase in TSH However does not appear to have any other symptoms suggestive of hypopituitarism She is not very symptomatic with her hypothyroidism despite significantly high TSH levels. Her main difficulty has been weight gain. However she has  had other issues like ADD which may make her symptoms difficult to sort out  Hypercholesterolemia, mild with no other risk factors  Treatment:    Since her TSH is significantly high and she had required 125 mcg Synthroid previously when her weight was about the same she will go back to this dose Have discussed needing to check the TSH periodically and regular followup She needs to take the medication in the morning before breakfast Discussed benefits of staying with a brand name supplement to avoid fluctuation in the dose, given her a co-pay card She will followup in 6 weeks with repeat TSH and free T4  Caylan Chenard 05/21/2013, 10:35 AM

## 2013-05-21 NOTE — Patient Instructions (Signed)
Synthroid 125ug daily

## 2013-06-04 ENCOUNTER — Emergency Department (HOSPITAL_COMMUNITY)
Admission: EM | Admit: 2013-06-04 | Discharge: 2013-06-04 | Disposition: A | Discharge status: Home / Self Care | Payer: BC Managed Care – PPO | Attending: Emergency Medicine | Admitting: Emergency Medicine

## 2013-06-04 ENCOUNTER — Emergency Department (INDEPENDENT_AMBULATORY_CARE_PROVIDER_SITE_OTHER)
Admission: EM | Admit: 2013-06-04 | Discharge: 2013-06-04 | Disposition: A | Discharge status: Nursing Home (Includes ICF) | Payer: BC Managed Care – PPO | Source: Home / Self Care

## 2013-06-04 ENCOUNTER — Encounter (HOSPITAL_COMMUNITY): Payer: Self-pay | Admitting: Emergency Medicine

## 2013-06-04 ENCOUNTER — Emergency Department (HOSPITAL_COMMUNITY): Payer: BC Managed Care – PPO

## 2013-06-04 DIAGNOSIS — R51 Headache: Secondary | ICD-10-CM | POA: Insufficient documentation

## 2013-06-04 DIAGNOSIS — Z79899 Other long term (current) drug therapy: Secondary | ICD-10-CM | POA: Insufficient documentation

## 2013-06-04 DIAGNOSIS — K219 Gastro-esophageal reflux disease without esophagitis: Secondary | ICD-10-CM | POA: Insufficient documentation

## 2013-06-04 DIAGNOSIS — E039 Hypothyroidism, unspecified: Secondary | ICD-10-CM | POA: Insufficient documentation

## 2013-06-04 DIAGNOSIS — R1011 Right upper quadrant pain: Secondary | ICD-10-CM | POA: Insufficient documentation

## 2013-06-04 DIAGNOSIS — R079 Chest pain, unspecified: Secondary | ICD-10-CM

## 2013-06-04 DIAGNOSIS — R10816 Epigastric abdominal tenderness: Secondary | ICD-10-CM

## 2013-06-04 DIAGNOSIS — R10813 Right lower quadrant abdominal tenderness: Secondary | ICD-10-CM

## 2013-06-04 LAB — COMPREHENSIVE METABOLIC PANEL
ALT: 6 U/L (ref 0–35)
AST: 14 U/L (ref 0–37)
Alkaline Phosphatase: 67 U/L (ref 39–117)
BUN: 14 mg/dL (ref 6–23)
CO2: 24 mEq/L (ref 19–32)
Chloride: 103 mEq/L (ref 96–112)
Creatinine, Ser: 0.81 mg/dL (ref 0.50–1.10)
GFR calc non Af Amer: 86 mL/min — ABNORMAL LOW (ref >90)
Glucose, Bld: 95 mg/dL (ref 70–99)
Potassium: 3.7 mEq/L (ref 3.7–5.3)
Total Bilirubin: 0.2 mg/dL — ABNORMAL LOW (ref 0.3–1.2)

## 2013-06-04 LAB — CBC WITH DIFFERENTIAL/PLATELET
Basophils Absolute: 0 10*3/uL (ref 0.0–0.1)
HCT: 36.8 % (ref 36.0–46.0)
Hemoglobin: 12.3 g/dL (ref 12.0–15.0)
Lymphocytes Relative: 29 % (ref 12–46)
Lymphs Abs: 1.6 10*3/uL (ref 0.7–4.0)
MCHC: 33.4 g/dL (ref 30.0–36.0)
Monocytes Absolute: 0.4 10*3/uL (ref 0.1–1.0)
Monocytes Relative: 8 % (ref 3–12)
Neutro Abs: 3.3 10*3/uL (ref 1.7–7.7)
Neutrophils Relative %: 62 % (ref 43–77)
Platelets: 244 10*3/uL (ref 150–400)
RBC: 4.16 MIL/uL (ref 3.87–5.11)
RDW: 13.1 % (ref 11.5–15.5)
WBC: 5.4 10*3/uL (ref 4.0–10.5)

## 2013-06-04 LAB — LIPASE, BLOOD: Lipase: 20 U/L (ref 11–59)

## 2013-06-04 LAB — POCT I-STAT TROPONIN I: Troponin i, poc: 0 ng/mL (ref 0.00–0.08)

## 2013-06-04 MED ORDER — GI COCKTAIL ~~LOC~~
30.0000 mL | Freq: Once | ORAL | Status: AC
  Administered 2013-06-04: 30 mL via ORAL
  Filled 2013-06-04: qty 30

## 2013-06-04 MED ORDER — ACETAMINOPHEN 500 MG PO TABS
1000.0000 mg | ORAL_TABLET | Freq: Once | ORAL | Status: AC
  Administered 2013-06-04: 1000 mg via ORAL
  Filled 2013-06-04: qty 2

## 2013-06-04 NOTE — ED Provider Notes (Signed)
CSN: [id number]     Arrival date & time 06/04/13  1055 History   First MD Initiated Contact with Patient 06/04/13 1127     Chief Complaint  Patient presents with  . Abdominal Pain    ucc transfer  . Chest Pain   (Consider location/radiation/quality/duration/timing/severity/associated sxs/prior Treatment) HPI Patient developed left-sided parasternal chest pain that radiates to the back immediately afterward eating [employer] last night. Pain is worse with taking deep breath worse with lying supine. No fever no shortness of breath feels like indigestion. She's had similar pain 2 weeks ago. Result spontaneously. Pain is not exertional.. She denies abdominal pain. She was seen at [facility] prior to coming here. Sent here for further evaluation. Abdominal tenderness at right lower quadrant noted on exam at urgent care center. No fever no blood per rectum. Last bowel movement this week, normal. Last normal menstrual period 05/28/2013. No urinary symptoms. No treatment prior to coming here. No other associated symptoms. Past Medical History  Diagnosis Date  . ASCUS (atypical squamous cells of undetermined significance) on Pap smear 04/2010    NEG HPV  . GERD (gastroesophageal reflux disease)   . Hypothyroid    Past Surgical History  Procedure Laterality Date  . Tubal ligation    . Augmentation mammaplasty     No family history on file. History  Substance Use Topics  . Smoking status: Never Smoker   . Smokeless tobacco: Never Used  . Alcohol Use: No     Comment: 1-3 WEEK   OB History   Grav Para Term Preterm Abortions TAB SAB Ect Mult Living   4 3   1     3      Review of Systems  Constitutional: Negative.   Respiratory: Negative.   Cardiovascular: Positive for chest pain.  Gastrointestinal: Negative.   Musculoskeletal: Negative.   Skin: Negative.   Neurological: Positive for headaches.       Diffuse mild headache onset this morning  Psychiatric/Behavioral:  Negative.   All other systems reviewed and are negative.    Allergies  Review of patient's allergies indicates no known allergies.  Home Medications   Current Outpatient Rx  Name  Route  Sig  Dispense  Refill  . amphetamine-dextroamphetamine (ADDERALL) 20 MG tablet   Oral   Take 20 mg by mouth 2 (two) times daily.          . Ascorbic Acid (VITAMIN C PO)   Oral   Take 1 tablet by mouth daily.         Marland Kitchen BIOTIN PO   Oral   Take 1 tablet by mouth daily.          . Cholecalciferol (VITAMIN D PO)   Oral   Take 1 tablet by mouth daily.         . Cyanocobalamin (VITAMIN B 12 PO)   Oral   Take 1 tablet by mouth daily.         Marland Kitchen ibuprofen (ADVIL,MOTRIN) 800 MG tablet   Oral   Take 800 mg by mouth every 8 (eight) hours as needed for pain.          Marland Kitchen levothyroxine (SYNTHROID) 125 MCG tablet   Oral   Take 1 tablet (125 mcg total) by mouth daily before breakfast.   30 tablet   1    BP 128/82  Pulse 92  Temp(Src) 98.2 F (36.8 C) (Oral)  Resp 20  Ht 5\' 4"  (1.626 m)  Wt 172  lb (78.019 kg)  BMI 29.51 kg/m2  SpO2 99%  LMP 05/28/2013 Physical Exam  Nursing note and vitals reviewed. Constitutional: She is oriented to person, place, and time. She appears well-developed and well-nourished.  HENT:  Head: Normocephalic and atraumatic.  Eyes: Conjunctivae are normal. Pupils are equal, round, and reactive to light.  Neck: Neck supple. No tracheal deviation present. No thyromegaly present.  Cardiovascular: Normal rate and regular rhythm.  Exam reveals no friction rub.   No murmur heard. Pulmonary/Chest: Effort normal and breath sounds normal. She exhibits tenderness.  Chest is tender at left sided parasternal area, reproducing pain exactly.  Abdominal: Soft. Bowel sounds are normal. She exhibits no distension. There is tenderness.  Tender right upper quadrant. Minimally. Negative Murphy's. No right lower quadrant tenderness. Abdomen is otherwise nontender.   Musculoskeletal: Normal range of motion. She exhibits no edema and no tenderness.  Neurological: She is alert and oriented to person, place, and time. No cranial nerve deficit. Coordination normal.  Skin: Skin is warm and dry. No rash noted.  Psychiatric: She has a normal mood and affect.    ED Course  Procedures (including critical care time) Labs Review Labs Reviewed  CBC WITH DIFFERENTIAL  COMPREHENSIVE METABOLIC PANEL  LIPASE, BLOOD   Imaging Review No results found.  EKG Interpretation    Date/Time:  Wednesday June 04 2013 11:09:39 EST Ventricular Rate:  59 PR Interval:  126 QRS Duration: 80 QT Interval:  384 QTC Calculation: 380 R Axis:   86 Text Interpretation:  Sinus bradycardia Otherwise normal ECG Since last tracing rate slower Confirmed by Dejay Kronk  MD, Addylynn Balin ([id number]) on 06/04/2013 11:58:36 AM           1:10 PM patient feels much improved after treatment with GI cocktail and Tylenol. Results for orders placed during the hospital encounter of 06/04/13  CBC WITH DIFFERENTIAL      Result Value Range   WBC 5.4  4.0 - 10.5 K/uL   RBC 4.16  3.87 - 5.11 MIL/uL   Hemoglobin 12.3  12.0 - 15.0 g/dL   HCT 64.4  03.4 - 74.2 %   MCV 88.5  78.0 - 100.0 fL   MCH 29.6  26.0 - 34.0 pg   MCHC 33.4  30.0 - 36.0 g/dL   RDW 59.5  63.8 - 75.6 %   Platelets 244  150 - 400 K/uL   Neutrophils Relative % 62  43 - 77 %   Neutro Abs 3.3  1.7 - 7.7 K/uL   Lymphocytes Relative 29  12 - 46 %   Lymphs Abs 1.6  0.7 - 4.0 K/uL   Monocytes Relative 8  3 - 12 %   Monocytes Absolute 0.4  0.1 - 1.0 K/uL   Eosinophils Relative 1  0 - 5 %   Eosinophils Absolute 0.1  0.0 - 0.7 K/uL   Basophils Relative 1  0 - 1 %   Basophils Absolute 0.0  0.0 - 0.1 K/uL  COMPREHENSIVE METABOLIC PANEL      Result Value Range   Sodium 139  137 - 147 mEq/L   Potassium 3.7  3.7 - 5.3 mEq/L   Chloride 103  96 - 112 mEq/L   CO2 24  19 - 32 mEq/L   Glucose, Bld 95  70 - 99 mg/dL   BUN 14  6 - 23 mg/dL    Creatinine, Ser 4.33  0.50 - 1.10 mg/dL   Calcium 8.9  8.4 - 29.5 mg/dL   Total Protein  6.9  6.0 - 8.3 g/dL   Albumin 3.8  3.5 - 5.2 g/dL   AST 14  0 - 37 U/L   ALT 6  0 - 35 U/L   Alkaline Phosphatase 67  39 - 117 U/L   Total Bilirubin 0.2 (*) 0.3 - 1.2 mg/dL   GFR calc non Af Amer 86 (*) >90 mL/min   GFR calc Af Amer >90  >90 mL/min  LIPASE, BLOOD      Result Value Range   Lipase 20  11 - 59 U/L  POCT I-STAT TROPONIN I      Result Value Range   Troponin i, poc 0.00  0.00 - 0.08 ng/mL   Comment 3            Dg Chest 2 View  06/04/2013   CLINICAL DATA:  Chest pain  EXAM: CHEST  2 VIEW  COMPARISON:  01/27/2011  FINDINGS: Cardiomediastinal silhouette is unremarkable. No acute infiltrate or pleural effusion. No pulmonary edema. Bony thorax is unremarkable.  IMPRESSION: No active cardiopulmonary disease.   Electronically Signed   By: Natasha Mead M.D.   On: 06/04/2013 13:04    MDM  No diagnosis found. There is a musculoskeletal component to her chest pain however symptoms likely consistent with GERD. Strongly doubt cardiac etiology of symptoms, given atypical symptoms nonacute EKG, negative troponin Worse with food worse with lying down. Doubt acute cholecystitis. Patient had negative abdominal ultrasound September 2014. Plan Prilosec OTC Maalox 2 tablespoons after meals and at bedtime. Follow up with Dr.Kim Dx #1 GERD #2 non specific headache   Doug Sou, MD 06/04/13 [phone]

## 2013-06-04 NOTE — ED Notes (Signed)
Spoke with phlebotomy re: Istat troponin needing to be drawn

## 2013-06-04 NOTE — ED Notes (Signed)
Pt sent from UC for RLQ tenderness and epigastric pain that radiates to back.  EKG unremarkable.  Pt given no medication at UC.

## 2013-06-04 NOTE — ED Notes (Signed)
47 yr old is here today complaints of acid indigestion that started around noon yesterday. She states she has tried [employer], Tums, and prilosec with no success. Denies: SOB; chest pain

## 2013-06-04 NOTE — ED Provider Notes (Signed)
CSN: [id number]     Arrival date & time 06/04/13  5366 History   First MD Initiated Contact with Patient 06/04/13 1003     Chief Complaint  Patient presents with  . Gastrophageal Reflux   (Consider location/radiation/quality/duration/timing/severity/associated sxs/prior Treatment) HPI Comments: 47 year old female presents with moderate to severe anterior chest pain that radiates to the right low back. It began after eating supper last night. She feels like she has been punched in the anterior chest and she has been stabbed in the back. The pain is constant but it is much worse when lying down. When in supine position she states it is hard to breathe and feels heavy over her chest. She denies cough or dyspnea when standing or sitting upright. She had an event last night when she was lying supine and having pain she vomited once.  Patient is a 47 y.o. female presenting with GERD.  Gastrophageal Reflux Associated symptoms include chest pain.    Past Medical History  Diagnosis Date  . ASCUS (atypical squamous cells of undetermined significance) on Pap smear 04/2010    NEG HPV  . GERD (gastroesophageal reflux disease)   . Hypothyroid    Past Surgical History  Procedure Laterality Date  . Tubal ligation    . Augmentation mammaplasty     No family history on file. History  Substance Use Topics  . Smoking status: Never Smoker   . Smokeless tobacco: Never Used  . Alcohol Use: No     Comment: 1-3 WEEK   OB History   Grav Para Term Preterm Abortions TAB SAB Ect Mult Living   4 3   1     3      Review of Systems  Constitutional: Negative.   HENT: Negative.   Respiratory: Negative for cough and wheezing.        As per history of present illness  Cardiovascular: Positive for chest pain. Negative for palpitations and leg swelling.  Gastrointestinal: Positive for vomiting. Negative for abdominal distention.  Genitourinary: Negative.   Musculoskeletal: Positive for back pain.  Skin:  Negative for rash.  Neurological: Negative.     Allergies  Review of patient's allergies indicates no known allergies.  Home Medications   Current Outpatient Rx  Name  Route  Sig  Dispense  Refill  . amphetamine-dextroamphetamine (ADDERALL) 20 MG tablet   Oral   Take 20 mg by mouth 2 (two) times daily.          Marland Kitchen BIOTIN PO   Oral   Take 1 tablet by mouth daily.          Marland Kitchen ibuprofen (ADVIL,MOTRIN) 800 MG tablet   Oral   Take 800 mg by mouth every 8 (eight) hours as needed for pain.          Marland Kitchen levothyroxine (SYNTHROID) 125 MCG tablet   Oral   Take 1 tablet (125 mcg total) by mouth daily before breakfast.   30 tablet   1    BP 116/80  Pulse 84  Temp(Src) 97.6 F (36.4 C) (Oral)  Resp 18  SpO2 98%  LMP 05/28/2013 Physical Exam  Nursing note and vitals reviewed. Constitutional: She is oriented to person, place, and time. She appears well-developed and well-nourished. No distress.  HENT:  Head: Normocephalic and atraumatic.  Eyes: EOM are normal. Pupils are equal, round, and reactive to light.  Neck: Normal range of motion. Neck supple.  Cardiovascular: Normal rate, regular rhythm and normal heart sounds.   Pulmonary/Chest: Effort normal  and breath sounds normal. No respiratory distress. She has no wheezes.  There is severe tenderness when palpating the left sternal border and left anterior chest wall. Palpation of the right lower back to include the posterior ribs produces tenderness.  Abdominal: Soft. She exhibits no mass. There is tenderness. There is guarding. There is no rebound.  Examination of the abdomen reveals palpation of the epigastrium produced a surprising leak moderate to severe pain. Palpation of the right lower quadrant and mid lower abdomen also reduces moderate to severe pain for which she rates as 6-7/10. She denied having abdominal pain however she definitely has moderate to severeabdominal tenderness.  Musculoskeletal: Normal range of motion.  She exhibits no edema.  Lymphadenopathy:    She has no cervical adenopathy.  Neurological: She is alert and oriented to person, place, and time. No cranial nerve deficit.  Skin: Skin is warm and dry.  Psychiatric: She has a normal mood and affect.    ED Course  Procedures (including critical care time) Labs Review Labs Reviewed - No data to display Imaging Review No results found.    MDM   1. Abdominal tenderness, epigastric   2. Abdominal tenderness, right lower quadrant   3. Chest pain     The patient does have costochondritis and chest wall pain however she has also had vomiting, heaviness in the anterior chest with dyspnea when supine, moderate to severe epigastric and right lower quadrant tenderness. At the outset it does appear to be gastrointestinal etiology along with chest wall tenderness however variables that enter the history and exam may suggest other pathology.therefore, she will be transferred via shuttle to the emergency department for further evaluation that may include lab work and or imaging.  Hayden Rasmussen, NP 06/04/13 [phone]

## 2013-06-08 NOTE — ED Provider Notes (Signed)
Medical screening examination/treatment/procedure(s) were performed by a resident physician or non-physician practitioner and as the supervising physician I was immediately available for consultation/collaboration.  Presten Joost, MD    Niyla Marone S Andric Kerce, MD 06/08/13 0846 

## 2013-06-17 NOTE — Progress Notes (Signed)
This encounter was used for a Nurse visit .

## 2013-06-19 ENCOUNTER — Encounter (HOSPITAL_COMMUNITY): Payer: Self-pay | Admitting: Emergency Medicine

## 2013-06-19 ENCOUNTER — Emergency Department (HOSPITAL_COMMUNITY)
Admission: EM | Admit: 2013-06-19 | Discharge: 2013-06-19 | Disposition: A | Discharge status: Home / Self Care | Payer: BC Managed Care – PPO | Attending: Emergency Medicine | Admitting: Emergency Medicine

## 2013-06-19 ENCOUNTER — Emergency Department (HOSPITAL_COMMUNITY): Payer: BC Managed Care – PPO

## 2013-06-19 DIAGNOSIS — S20219A Contusion of unspecified front wall of thorax, initial encounter: Secondary | ICD-10-CM | POA: Insufficient documentation

## 2013-06-19 DIAGNOSIS — R109 Unspecified abdominal pain: Secondary | ICD-10-CM | POA: Insufficient documentation

## 2013-06-19 DIAGNOSIS — K219 Gastro-esophageal reflux disease without esophagitis: Secondary | ICD-10-CM | POA: Insufficient documentation

## 2013-06-19 DIAGNOSIS — E039 Hypothyroidism, unspecified: Secondary | ICD-10-CM | POA: Insufficient documentation

## 2013-06-19 DIAGNOSIS — R209 Unspecified disturbances of skin sensation: Secondary | ICD-10-CM | POA: Insufficient documentation

## 2013-06-19 DIAGNOSIS — Y939 Activity, unspecified: Secondary | ICD-10-CM | POA: Insufficient documentation

## 2013-06-19 DIAGNOSIS — M545 Low back pain, unspecified: Secondary | ICD-10-CM | POA: Insufficient documentation

## 2013-06-19 DIAGNOSIS — Z79899 Other long term (current) drug therapy: Secondary | ICD-10-CM | POA: Insufficient documentation

## 2013-06-19 DIAGNOSIS — R6889 Other general symptoms and signs: Secondary | ICD-10-CM | POA: Insufficient documentation

## 2013-06-19 LAB — BASIC METABOLIC PANEL
BUN: 21 mg/dL (ref 6–23)
CHLORIDE: 102 meq/L (ref 96–112)
CO2: 25 meq/L (ref 19–32)
CREATININE: 0.78 mg/dL (ref 0.50–1.10)
Calcium: 9.4 mg/dL (ref 8.4–10.5)
GFR calc Af Amer: >90 mL/min (ref >90)
GFR calc non Af Amer: >90 mL/min (ref >90)
Glucose, Bld: 110 mg/dL — ABNORMAL HIGH (ref 70–99)
Potassium: 4.4 mEq/L (ref 3.7–5.3)
Sodium: 139 mEq/L (ref 137–147)

## 2013-06-19 LAB — CBC WITH DIFFERENTIAL/PLATELET
Basophils Absolute: 0 10*3/uL (ref 0.0–0.1)
Basophils Relative: 0 % (ref 0–1)
Eosinophils Absolute: 0.1 10*3/uL (ref 0.0–0.7)
Eosinophils Relative: 1 % (ref 0–5)
HCT: 37.6 % (ref 36.0–46.0)
HEMOGLOBIN: 12.9 g/dL (ref 12.0–15.0)
LYMPHS PCT: 17 % (ref 12–46)
Lymphs Abs: 1.4 10*3/uL (ref 0.7–4.0)
MCH: 29.9 pg (ref 26.0–34.0)
MCHC: 34.3 g/dL (ref 30.0–36.0)
MCV: 87.2 fL (ref 78.0–100.0)
Monocytes Absolute: 0.4 10*3/uL (ref 0.1–1.0)
Monocytes Relative: 5 % (ref 3–12)
NEUTROS ABS: 6.4 10*3/uL (ref 1.7–7.7)
Neutrophils Relative %: 77 % (ref 43–77)
Platelets: 251 10*3/uL (ref 150–400)
RBC: 4.31 MIL/uL (ref 3.87–5.11)
RDW: 13 % (ref 11.5–15.5)
WBC: 8.2 10*3/uL (ref 4.0–10.5)

## 2013-06-19 LAB — PREGNANCY, URINE: Preg Test, Ur: NEGATIVE

## 2013-06-19 LAB — URINALYSIS, ROUTINE W REFLEX MICROSCOPIC
Bilirubin Urine: NEGATIVE
GLUCOSE, UA: NEGATIVE mg/dL
Hgb urine dipstick: NEGATIVE
KETONES UR: NEGATIVE mg/dL
Leukocytes, UA: NEGATIVE
Nitrite: NEGATIVE
PH: 7 (ref 5.0–8.0)
Protein, ur: NEGATIVE mg/dL
SPECIFIC GRAVITY, URINE: 1.008 (ref 1.005–1.030)
Urobilinogen, UA: 0.2 mg/dL (ref 0.0–1.0)

## 2013-06-19 MED ORDER — ONDANSETRON HCL 4 MG/2ML IJ SOLN
4.0000 mg | Freq: Once | INTRAMUSCULAR | Status: AC
  Administered 2013-06-19: 4 mg via INTRAVENOUS
  Filled 2013-06-19: qty 2

## 2013-06-19 MED ORDER — CYCLOBENZAPRINE HCL 10 MG PO TABS: 10.0000 mg | ORAL_TABLET | Freq: Two times a day (BID) | ORAL | Status: DC | PRN

## 2013-06-19 MED ORDER — IOHEXOL 300 MG/ML  SOLN: 80.0000 mL | Freq: Once | INTRAMUSCULAR | Status: DC | PRN

## 2013-06-19 MED ORDER — HYDROCODONE-ACETAMINOPHEN 5-325 MG PO TABS: 1.0000 | ORAL_TABLET | ORAL | Status: DC | PRN

## 2013-06-19 MED ORDER — MORPHINE SULFATE 4 MG/ML IJ SOLN: 4.0000 mg | Freq: Once | INTRAMUSCULAR | Status: DC

## 2013-06-19 MED ORDER — MORPHINE SULFATE 4 MG/ML IJ SOLN
4.0000 mg | Freq: Once | INTRAMUSCULAR | Status: AC
  Administered 2013-06-19: 4 mg via INTRAVENOUS
  Filled 2013-06-19: qty 1

## 2013-06-19 MED ORDER — IOHEXOL 300 MG/ML  SOLN: 100.0000 mL | Freq: Once | INTRAMUSCULAR | Status: DC | PRN

## 2013-06-19 MED ORDER — IOHEXOL 300 MG/ML  SOLN
100.0000 mL | Freq: Once | INTRAMUSCULAR | Status: AC | PRN
  Administered 2013-06-19: 100 mL via INTRAVENOUS

## 2013-06-19 MED ORDER — NAPROXEN 375 MG PO TABS: 375.0000 mg | ORAL_TABLET | Freq: Two times a day (BID) | ORAL | Status: DC
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
## 2013-06-19 NOTE — ED Notes (Signed)
Patient ambulated well, with standby assist.  Patient stated she felt a little dizzy when she stood up. But other than that she did well.
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
## 2013-06-20 NOTE — ED Provider Notes (Signed)
Medical screening examination/treatment/procedure(s) were performed by non-physician practitioner and as supervising physician I was immediately available for consultation/collaboration.  EKG Interpretation   None        Juliet RudeNathan R. Rubin PayorPickering, MD 06/20/13 [phone]

## 2013-07-01 ENCOUNTER — Encounter: Payer: BC Managed Care – PPO | Admitting: Family Medicine

## 2013-07-01 ENCOUNTER — Other Ambulatory Visit: Payer: BC Managed Care – PPO

## 2013-07-01 DIAGNOSIS — Z0289 Encounter for other administrative examinations: Secondary | ICD-10-CM

## 2013-07-01 NOTE — Progress Notes (Signed)
error    This encounter was created in error - please disregard.

## 2013-07-02 ENCOUNTER — Telehealth: Payer: Self-pay | Admitting: Endocrinology

## 2013-07-02 NOTE — Telephone Encounter (Signed)
LVM for pt regarding her labs from yesterday that she did not show for and reminded her of her follow up appt for tomorrow. Requested a call back regarding her labs

## 2013-07-03 ENCOUNTER — Ambulatory Visit: Payer: BC Managed Care – PPO | Admitting: Endocrinology
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
## 2013-07-04 ENCOUNTER — Other Ambulatory Visit: Payer: BC Managed Care – PPO

## 2013-07-07 ENCOUNTER — Ambulatory Visit: Payer: BC Managed Care – PPO | Admitting: Endocrinology

## 2013-07-07 DIAGNOSIS — Z0289 Encounter for other administrative examinations: Secondary | ICD-10-CM

## 2013-07-08 ENCOUNTER — Ambulatory Visit (HOSPITAL_COMMUNITY): Payer: BC Managed Care – PPO | Admitting: Psychology

## 2013-07-17 ENCOUNTER — Encounter: Payer: Self-pay | Admitting: Family Medicine
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
## 2013-07-28 ENCOUNTER — Other Ambulatory Visit (INDEPENDENT_AMBULATORY_CARE_PROVIDER_SITE_OTHER): Payer: BC Managed Care – PPO

## 2013-07-28 DIAGNOSIS — E039 Hypothyroidism, unspecified: Secondary | ICD-10-CM

## 2013-07-28 LAB — T4, FREE: FREE T4: 0.82 ng/dL (ref 0.60–1.60)

## 2013-07-28 LAB — TSH: TSH: 34.47 u[IU]/mL — AB (ref 0.35–5.50)

## 2013-10-28 ENCOUNTER — Encounter (HOSPITAL_COMMUNITY): Payer: Self-pay | Admitting: Emergency Medicine

## 2013-10-28 ENCOUNTER — Emergency Department (HOSPITAL_COMMUNITY): Payer: BC Managed Care – PPO

## 2013-10-28 ENCOUNTER — Telehealth: Payer: Self-pay | Admitting: Family Medicine

## 2013-10-28 ENCOUNTER — Emergency Department (HOSPITAL_COMMUNITY)
Admission: EM | Admit: 2013-10-28 | Discharge: 2013-10-29 | Disposition: A | Discharge status: Home / Self Care | Payer: BC Managed Care – PPO | Attending: Emergency Medicine | Admitting: Emergency Medicine

## 2013-10-28 DIAGNOSIS — M545 Low back pain, unspecified: Secondary | ICD-10-CM | POA: Insufficient documentation

## 2013-10-28 DIAGNOSIS — Z79899 Other long term (current) drug therapy: Secondary | ICD-10-CM | POA: Insufficient documentation

## 2013-10-28 DIAGNOSIS — E039 Hypothyroidism, unspecified: Secondary | ICD-10-CM | POA: Insufficient documentation

## 2013-10-28 DIAGNOSIS — M79605 Pain in left leg: Secondary | ICD-10-CM

## 2013-10-28 DIAGNOSIS — M79609 Pain in unspecified limb: Secondary | ICD-10-CM | POA: Insufficient documentation

## 2013-10-28 DIAGNOSIS — Z8719 Personal history of other diseases of the digestive system: Secondary | ICD-10-CM | POA: Insufficient documentation

## 2013-10-28 DIAGNOSIS — Z87828 Personal history of other (healed) physical injury and trauma: Secondary | ICD-10-CM | POA: Insufficient documentation

## 2013-10-28 DIAGNOSIS — M79604 Pain in right leg: Secondary | ICD-10-CM

## 2013-10-28 LAB — CBC WITH DIFFERENTIAL/PLATELET
BASOS PCT: 0 % (ref 0–1)
Basophils Absolute: 0 10*3/uL (ref 0.0–0.1)
Eosinophils Absolute: 0.1 10*3/uL (ref 0.0–0.7)
Eosinophils Relative: 2 % (ref 0–5)
HCT: 38.3 % (ref 36.0–46.0)
Hemoglobin: 12.6 g/dL (ref 12.0–15.0)
LYMPHS PCT: 31 % (ref 12–46)
Lymphs Abs: 2.4 10*3/uL (ref 0.7–4.0)
MCH: 28.8 pg (ref 26.0–34.0)
MCHC: 32.9 g/dL (ref 30.0–36.0)
MCV: 87.4 fL (ref 78.0–100.0)
MONO ABS: 0.5 10*3/uL (ref 0.1–1.0)
Monocytes Relative: 6 % (ref 3–12)
NEUTROS ABS: 4.8 10*3/uL (ref 1.7–7.7)
NEUTROS PCT: 61 % (ref 43–77)
PLATELETS: 244 10*3/uL (ref 150–400)
RBC: 4.38 MIL/uL (ref 3.87–5.11)
RDW: 13 % (ref 11.5–15.5)
WBC: 7.8 10*3/uL (ref 4.0–10.5)

## 2013-10-28 LAB — BASIC METABOLIC PANEL
BUN: 18 mg/dL (ref 6–23)
CHLORIDE: 99 meq/L (ref 96–112)
CO2: 26 meq/L (ref 19–32)
CREATININE: 1 mg/dL (ref 0.50–1.10)
Calcium: 9.5 mg/dL (ref 8.4–10.5)
GFR calc non Af Amer: 66 mL/min — ABNORMAL LOW (ref >90)
GFR, EST AFRICAN AMERICAN: 77 mL/min — AB (ref >90)
Glucose, Bld: 104 mg/dL — ABNORMAL HIGH (ref 70–99)
POTASSIUM: 4 meq/L (ref 3.7–5.3)
SODIUM: 137 meq/L (ref 137–147)

## 2013-10-28 LAB — URINALYSIS, ROUTINE W REFLEX MICROSCOPIC
BILIRUBIN URINE: NEGATIVE
GLUCOSE, UA: NEGATIVE mg/dL
KETONES UR: NEGATIVE mg/dL
Leukocytes, UA: NEGATIVE
Nitrite: NEGATIVE
Protein, ur: NEGATIVE mg/dL
Specific Gravity, Urine: 1.015 (ref 1.005–1.030)
Urobilinogen, UA: 0.2 mg/dL (ref 0.0–1.0)
pH: 5.5 (ref 5.0–8.0)

## 2013-10-28 LAB — URINE MICROSCOPIC-ADD ON

## 2013-10-28 MED ORDER — FENTANYL CITRATE 0.05 MG/ML IJ SOLN
100.0000 ug | INTRAMUSCULAR | Status: DC | PRN
  Administered 2013-10-28: 100 ug via INTRAVENOUS
  Filled 2013-10-28: qty 2
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
## 2013-10-28 NOTE — ED Notes (Signed)
Pt is able to stand or walk without pain.
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
## 2013-10-29 LAB — URINE CULTURE

## 2013-10-29 MED ORDER — ONDANSETRON 8 MG PO TBDP
8.0000 mg | ORAL_TABLET | Freq: Once | ORAL | Status: AC
  Administered 2013-10-29: 8 mg via ORAL
  Filled 2013-10-29: qty 1

## 2013-10-29 MED ORDER — IOHEXOL 300 MG/ML  SOLN
100.0000 mL | Freq: Once | INTRAMUSCULAR | Status: AC | PRN
  Administered 2013-10-29: 100 mL via INTRAVENOUS

## 2013-10-29 MED ORDER — PREDNISONE 20 MG PO TABS: 20.0000 mg | ORAL_TABLET | Freq: Two times a day (BID) | ORAL | Status: DC

## 2013-10-29 MED ORDER — OXYCODONE-ACETAMINOPHEN 5-325 MG PO TABS: 1.0000 | ORAL_TABLET | ORAL | Status: DC | PRN

## 2013-10-29 NOTE — Discharge Instructions (Signed)
Rest in bed as much as possible. Followup, with your doctor if not better in 3 days.   Musculoskeletal Pain Musculoskeletal pain is muscle and boney aches and pains. These pains can occur in any part of the body. Your caregiver may treat you without knowing the cause of the pain. They may treat you if blood or urine tests, X-rays, and other tests were normal.  CAUSES There is often not a definite cause or reason for these pains. These pains may be caused by a type of germ (virus). The discomfort may also come from overuse. Overuse includes working out too hard when your body is not fit. Boney aches also come from weather changes. Bone is sensitive to atmospheric pressure changes. HOME CARE INSTRUCTIONS   Ask when your test results will be ready. Make sure you get your test results.  Only take over-the-counter or prescription medicines for pain, discomfort, or fever as directed by your caregiver. If you were given medications for your condition, do not drive, operate machinery or power tools, or sign legal documents for 24 hours. Do not drink alcohol. Do not take sleeping pills or other medications that may interfere with treatment.  Continue all activities unless the activities cause more pain. When the pain lessens, slowly resume normal activities. Gradually increase the intensity and duration of the activities or exercise.  During periods of severe pain, bed rest may be helpful. Lay or sit in any position that is comfortable.  Putting ice on the injured area.  Put ice in a bag.  Place a towel between your skin and the bag.  Leave the ice on for 15 to 20 minutes, 3 to 4 times a day.  Follow up with your caregiver for continued problems and no reason can be found for the pain. If the pain becomes worse or does not go away, it may be necessary to repeat tests or do additional testing. Your caregiver may need to look further for a possible cause. SEEK IMMEDIATE MEDICAL CARE IF:  You have  pain that is getting worse and is not relieved by medications.  You develop chest pain that is associated with shortness or breath, sweating, feeling sick to your stomach (nauseous), or throw up (vomit).  Your pain becomes localized to the abdomen.  You develop any new symptoms that seem different or that concern you. MAKE SURE YOU:   Understand these instructions.  Will watch your condition.  Will get help right away if you are not doing well or get worse. Document Released: 05/22/2005 Document Revised: 08/14/2011 Document Reviewed: 01/24/2013 [facility] Patient Information 2014 [place], [place].

## 2013-10-31 ENCOUNTER — Telehealth: Payer: Self-pay | Admitting: Family Medicine

## 2013-10-31 DIAGNOSIS — R200 Anesthesia of skin: Secondary | ICD-10-CM

## 2013-10-31 NOTE — Telephone Encounter (Signed)
Pt called and is requesting a referral to a neurologist she went to the ER on 10/28/13   for numbness down both legs.

## 2013-10-31 NOTE — Telephone Encounter (Signed)
Patient informed. 

## 2013-10-31 NOTE — Telephone Encounter (Signed)
Pt would like a referral to an orthopedic doctor that knows about the spine.  She doesn't want to see Dr. Dion Saucier anymore.

## 2013-10-31 NOTE — Telephone Encounter (Signed)
I would advise follow up with her current orthopedic doctor as they know her history and have done a workup I believe an MRI of her neck? She was supposed to follow up with them 4 weeks after the last visit with them. If they feel they can not help her then perhaps they could advise whom she should see?

## 2013-10-31 NOTE — Telephone Encounter (Signed)
I called the pt and advised her someone from our office will call with the appt for the neurologist and she should follow up with the orthopedist and she agreed.

## 2013-10-31 NOTE — Telephone Encounter (Signed)
Ok to send referral and she should follow up with her orthopedic doctor if she has not already.

## 2013-11-04 ENCOUNTER — Ambulatory Visit (INDEPENDENT_AMBULATORY_CARE_PROVIDER_SITE_OTHER): Payer: BC Managed Care – PPO | Admitting: Neurology

## 2013-11-04 ENCOUNTER — Other Ambulatory Visit: Payer: Self-pay | Admitting: Neurology

## 2013-11-04 ENCOUNTER — Encounter: Payer: Self-pay | Admitting: Neurology

## 2013-11-04 VITALS — BP 118/80 | HR 79 | Ht 63.78 in | Wt 184.0 lb

## 2013-11-04 DIAGNOSIS — R269 Unspecified abnormalities of gait and mobility: Secondary | ICD-10-CM

## 2013-11-04 DIAGNOSIS — R52 Pain, unspecified: Secondary | ICD-10-CM

## 2013-11-04 DIAGNOSIS — R209 Unspecified disturbances of skin sensation: Secondary | ICD-10-CM

## 2013-11-04 LAB — C-REACTIVE PROTEIN

## 2013-11-04 LAB — SEDIMENTATION RATE: SED RATE: 14 mm/h (ref 0–22)

## 2013-11-04 MED ORDER — NORTRIPTYLINE HCL 10 MG PO CAPS: 10.0000 mg | ORAL_CAPSULE | Freq: Every day | ORAL | Status: DC
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
## 2013-11-04 NOTE — Patient Instructions (Addendum)
MRI brain wwo contrast MRI lumbar wwo contrast Check blood work  Start taking nortriptyline 10mg  at bedtime Consider EMG, if patient agreeable Return to clinic in 64-months

## 2013-11-05 LAB — GLIADIN ANTIBODIES, SERUM
GLIADIN IGG: 21.3 U/mL — AB (ref <20)
Gliadin IgA: 5.2 U/mL (ref <20)

## 2013-11-05 LAB — T4, FREE: Free T4: 0.74 ng/dL — ABNORMAL LOW (ref 0.80–1.80)

## 2013-11-05 LAB — VITAMIN B12: VITAMIN B 12: 441 pg/mL (ref 211–911)

## 2013-11-05 LAB — TSH: TSH: 76.358 u[IU]/mL — ABNORMAL HIGH (ref 0.350–4.500)

## 2013-11-05 LAB — ANA: Anti Nuclear Antibody(ANA): NEGATIVE

## 2013-11-05 LAB — TISSUE TRANSGLUTAMINASE, IGA: Tissue Transglutaminase Ab, IgA: 2.6 U/mL (ref <20)

## 2013-11-06 LAB — COPPER, SERUM: Copper: 108 ug/dL (ref 70–175)

## 2013-11-07 LAB — RETICULIN ANTIBODIES, IGA W TITER: RETICULIN AB, IGA: NEGATIVE

## 2013-11-19 ENCOUNTER — Ambulatory Visit (HOSPITAL_COMMUNITY)
Admission: RE | Admit: 2013-11-19 | Discharge: 2013-11-19 | Disposition: A | Discharge status: Home / Self Care | Payer: BC Managed Care – PPO | Source: Ambulatory Visit | Attending: Neurology | Admitting: Neurology

## 2013-11-19 DIAGNOSIS — M549 Dorsalgia, unspecified: Secondary | ICD-10-CM | POA: Insufficient documentation

## 2013-11-19 DIAGNOSIS — M48061 Spinal stenosis, lumbar region without neurogenic claudication: Secondary | ICD-10-CM | POA: Insufficient documentation

## 2013-11-19 DIAGNOSIS — M79609 Pain in unspecified limb: Secondary | ICD-10-CM | POA: Insufficient documentation

## 2013-11-19 MED ORDER — GADOBENATE DIMEGLUMINE 529 MG/ML IV SOLN: 19.0000 mL | Freq: Once | INTRAVENOUS | Status: DC | PRN

## 2013-11-19 MED ORDER — GADOBENATE DIMEGLUMINE 529 MG/ML IV SOLN
20.0000 mL | Freq: Once | INTRAVENOUS | Status: AC
  Administered 2013-11-19: 20 mL via INTRAVENOUS

## 2013-12-03 ENCOUNTER — Ambulatory Visit (INDEPENDENT_AMBULATORY_CARE_PROVIDER_SITE_OTHER): Payer: BC Managed Care – PPO | Admitting: Endocrinology

## 2013-12-03 ENCOUNTER — Encounter: Payer: Self-pay | Admitting: Endocrinology

## 2013-12-03 ENCOUNTER — Other Ambulatory Visit: Payer: Self-pay | Admitting: *Deleted

## 2013-12-03 VITALS — BP 114/68 | HR 79 | Temp 97.9°F | Resp 14 | Ht 64.0 in | Wt 181.0 lb

## 2013-12-03 DIAGNOSIS — G4700 Insomnia, unspecified: Secondary | ICD-10-CM

## 2013-12-03 DIAGNOSIS — E039 Hypothyroidism, unspecified: Secondary | ICD-10-CM

## 2013-12-03 DIAGNOSIS — IMO0001 Reserved for inherently not codable concepts without codable children: Secondary | ICD-10-CM

## 2013-12-03 MED ORDER — LEVOTHYROXINE SODIUM 175 MCG PO TABS: 175.0000 ug | ORAL_TABLET | Freq: Every day | ORAL | Status: DC

## 2013-12-03 NOTE — Patient Instructions (Signed)
New Rx

## 2013-12-03 NOTE — Progress Notes (Addendum)
Patient ID: Joy Wilson, female   DOB: February 19, 1966, 48 y.o.   MRN: [id number]   Reason for Appointment:  Hypothyroidism    History of Present Illness:   The Hyothyroidism was first diagnosed in 2002   Previous history: At diagnosis she had noticed a rapid weight gain of about 30 pounds in 2 months.  She was given a thyroid supplement and she took them for 3-4 years with only modest benefit. She says that her weight did not consistently come down and she did not feel any better otherwise. She stopped the medication for 2-3 years on her own without experiencing symptoms. In 2008 when she was admitted for syncope she had significant hypothyroidism diagnosed and started taking Synthroid, initially 88 mcg and in 2012 this was increased to 125 mcg Until 12/14 she had been seeing a holistic physician who was given her unknown supplements and a weight loss program. Apparently her thyroid supplements were weaned off and she had been off her Synthroid for about 6 months  Recent history: Since her TSH was significantly high at 24.4 in 12/14 she was given 125 mcg of levothyroxine instead of the 25 that she was taking However she did not followup until today For the last month or so she has been having marked problems with pains in her legs and difficulty moving around. She also has occasional sharp pains in her hands. She says her skin is hypersensitive and can get warm with some flushing or rash like changes in her legs She tends to get cold easily now with her skin and may get warm She has had chronic constipation which is not new; no hair loss  Treatment:  For the last few months she has been erratic with her thyroid supplementation and when an average had been taking it only 5 days a week She was seen by the neurologist for this and found to have a markedly increased TSH Over the last month she has been trying to do better and trying to take her thyroid medication daily before breakfast          Lab Results  Component Value Date   FREET4 0.74* 11/04/2013   FREET4 0.82 07/28/2013   FREET4 0.56* 02/27/2013   TSH 76.358* 11/04/2013   TSH 34.47* 07/28/2013   TSH 24.38* 05/12/2013     Past Medical History  Diagnosis Date  . ASCUS (atypical squamous cells of undetermined significance) on Pap smear 04/2010    NEG HPV  . GERD (gastroesophageal reflux disease)   . Hypothyroid     Past Surgical History  Procedure Laterality Date  . Tubal ligation    . Augmentation mammaplasty      Family History  Problem Relation Age of Onset  . Other Father     Unknown  . Healthy Sister   . Healthy Son   . Healthy Daughter     Social History:  reports that she has never smoked. She has never used smokeless tobacco. She reports that she drinks alcohol. She reports that she does not use illicit drugs.  Allergies: No Known Allergies    Medication List       This list is accurate as of: 12/03/13 11:50 AM.  Always use your most recent med list.               amphetamine-dextroamphetamine 20 MG tablet  Commonly known as:  ADDERALL  Take 20 mg by mouth 4 (four) times daily.     BIOTIN PO  Take  1 tablet by mouth daily.     levothyroxine 125 MCG tablet  Commonly known as:  SYNTHROID  Take 1 tablet (125 mcg total) by mouth daily before breakfast.     nortriptyline 10 MG capsule  Commonly known as:  PAMELOR  Take 1 capsule (10 mg total) by mouth at bedtime.     oxyCODONE-acetaminophen 5-325 MG per tablet  Commonly known as:  PERCOCET  Take 1 tablet by mouth every 4 (four) hours as needed for severe pain.     predniSONE 20 MG tablet  Commonly known as:  DELTASONE  Take 1 tablet (20 mg total) by mouth 2 (two) times daily.     VITAMIN B 12 PO  Take 1 tablet by mouth daily.     VITAMIN C PO  Take 1 tablet by mouth daily.     VITAMIN D PO  Take 5,000 Units by mouth daily.        Review of Systems:  She has marked insomnia Evaluation for her myalgia showed normal studies  including ESR, CPK not available           GASTROENTEROLOGY:  no Change in bowel habits, has had chronic severe constipation.      Sometimes her right ankle is very swollen       She has regular menstrual cycles She has mild hypercholesterolemia, last LDL 134   Examination:    BP 114/68  Pulse 79  Temp(Src) 97.9 F (36.6 C)  Resp 14  Ht _0  (1.626 m)  Wt 181 lb (82.101 kg)  BMI 31.05 kg/m2  SpO2 97%   General Appearance: pleasant, well-built and nourished, generalized obesity present         Eyes: No proptosis or eyelid swelling .          Neck: The thyroid is nonpalpable   Cardiovascular: Normal  heart sounds, no murmur    Neurological: REFLEXES: at biceps and triceps are 1+  with  normal relaxation    Skin:  Normal in upper extremities; mild light petechial type rash on the lower legs   Assessments    Hypothyroidism, primary and long-standing. Patient is significantly hypothyroid recently and this likely indicates progression of her disease Although she has not taken her thyroid supplement daily as directed since her last visit her TSH being much higher is unexpected Currently she is taking the average equivalent of about 90 mg of levothyroxine daily Not clear if her symptoms of leg pain and soreness as well as paresthesiae are related to hypothyroidism but does not appear to have any other obvious etiology Also she does not appear to be symptomatically better with even though she has been compliant with her medication for about 4 weeks Objectively today does not euthyroid  Pain in legs and insomnia: May have an element of fibromyalgia  Treatment:    Since her TSH is significantly high with recent regimen will increase her dose to 175 mcg  Discussed timing of taking her medication and interactive substances She will followup in 5 weeks with repeat TSH and free T4  Advised her to followup with PCP for possible rheumatology consultation In the meantime she can try  taking nortriptyline again; most likely will need at least 25 mg at bedtime  Melaysia Streed 12/03/2013, 11:50 AM

## 2013-12-10 ENCOUNTER — Ambulatory Visit (INDEPENDENT_AMBULATORY_CARE_PROVIDER_SITE_OTHER): Payer: BC Managed Care – PPO | Admitting: Family Medicine

## 2013-12-10 ENCOUNTER — Encounter: Payer: Self-pay | Admitting: Family Medicine

## 2013-12-10 VITALS — BP 118/80 | HR 103 | Ht 64.0 in | Wt 183.0 lb

## 2013-12-10 DIAGNOSIS — R202 Paresthesia of skin: Secondary | ICD-10-CM

## 2013-12-10 DIAGNOSIS — F32A Depression, unspecified: Secondary | ICD-10-CM

## 2013-12-10 DIAGNOSIS — F329 Major depressive disorder, single episode, unspecified: Secondary | ICD-10-CM

## 2013-12-10 DIAGNOSIS — E039 Hypothyroidism, unspecified: Secondary | ICD-10-CM

## 2013-12-10 DIAGNOSIS — F3289 Other specified depressive episodes: Secondary | ICD-10-CM

## 2013-12-10 DIAGNOSIS — IMO0001 Reserved for inherently not codable concepts without codable children: Secondary | ICD-10-CM

## 2013-12-10 DIAGNOSIS — R209 Unspecified disturbances of skin sensation: Secondary | ICD-10-CM

## 2013-12-10 DIAGNOSIS — M791 Myalgia, unspecified site: Secondary | ICD-10-CM
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
## 2013-12-10 NOTE — Progress Notes (Signed)
Pre visit review using our clinic review tool, if applicable. No additional management support is needed unless otherwise documented below in the visit note. 

## 2013-12-10 NOTE — Patient Instructions (Addendum)
-  start Vit D 1000 IU daily from [facility] - otherwise no supplements  -use nortriptyline as prescribed and ensure psychiatrist knows you are taking this  -see your psychiatrist for the depression  -We placed a referral for you as discussed to the rheumatologist. It usually takes about 1-2 weeks to process and schedule this referral. If you have not heard from us regarding this appointment in 2 weeks please contact our office.  -follow up with neurologist as scheduled and consider nerve conduction test  -I feel this is most likely related to your thyroid and depression so please follow endocrine recs carefully regarding your thyroid  -follow up with me in 3-4 months and or as needed

## 2014-01-05 ENCOUNTER — Other Ambulatory Visit (INDEPENDENT_AMBULATORY_CARE_PROVIDER_SITE_OTHER): Payer: BC Managed Care – PPO

## 2014-01-05 DIAGNOSIS — E039 Hypothyroidism, unspecified: Secondary | ICD-10-CM

## 2014-01-05 LAB — TSH: TSH: 0.05 u[IU]/mL — AB (ref 0.35–4.50)

## 2014-01-05 LAB — T4, FREE: Free T4: 1.63 ng/dL — ABNORMAL HIGH (ref 0.60–1.60)

## 2014-01-05 LAB — CK: Total CK: 32 U/L (ref 7–177)

## 2014-01-07 ENCOUNTER — Encounter: Payer: Self-pay | Admitting: Endocrinology

## 2014-01-07 ENCOUNTER — Telehealth: Payer: Self-pay | Admitting: Neurology

## 2014-01-07 ENCOUNTER — Ambulatory Visit (INDEPENDENT_AMBULATORY_CARE_PROVIDER_SITE_OTHER): Payer: BC Managed Care – PPO | Admitting: Endocrinology

## 2014-01-07 VITALS — BP 105/65 | HR 62 | Temp 97.9°F | Resp 16 | Ht 64.0 in | Wt 174.0 lb

## 2014-01-07 DIAGNOSIS — E039 Hypothyroidism, unspecified: Secondary | ICD-10-CM

## 2014-01-07 MED ORDER — LEVOTHYROXINE SODIUM 137 MCG PO TABS: 137.0000 ug | ORAL_TABLET | Freq: Every day | ORAL | Status: DC

## 2014-01-07 NOTE — Patient Instructions (Signed)
Change to 137 ug daily

## 2014-01-07 NOTE — Progress Notes (Signed)
Patient ID: Joy Wilson, female   DOB: April 15, 1966, 48 y.o.   MRN: [id number]   Reason for Appointment:  Hypothyroidism    History of Present Illness:   The Hyothyroidism was first diagnosed in 2002   Previous history: At diagnosis she had noticed a rapid weight gain of about 30 pounds in 2 months.  She was given a thyroid supplement and she took them for 3-4 years with only modest benefit. She says that her weight did not consistently come down and she did not feel any better otherwise. She stopped the medication for 2-3 years on her own without experiencing symptoms. In 2008 when she was admitted for syncope she had significant hypothyroidism diagnosed and started taking Synthroid, initially 88 mcg and in 2012 this was increased to 125 mcg Until 12/14 she had been seeing a holistic physician who was given her unknown supplements and a weight loss program. Apparently her thyroid supplements were weaned off and she had been off her Synthroid for about 6 months  Recent history:  TSH was significantly high at 24.4 in 12/14 when she was given 125 mcg of levothyroxine instead of the 25 that she was taking However she has had increased TSH levels subsequently and she was apparently not very irregular with taking her medication Prior to her last visit she had been having marked problems with pains in her legs and difficulty moving around.  Did not have any specific symptoms for hypothyroidism although did complain of persistent fatigue and some cold intolerance Because of her marked increase in TSH to 76 her dose was increased to 175 mcg She has had some decrease in her leg pains and able to walk better now but still complains of significant fatigue  She tends to get both hot and cold easily Her weight has improved She does not complain of any shakiness, jitteriness or palpitations but has lost 9 pounds Since her last visit she has been able to do much better and trying to take her thyroid  medication daily before breakfast        Wt Readings from Last 3 Encounters:  01/07/14 174 lb (78.926 kg)  12/10/13 183 lb (83.008 kg)  12/03/13 181 lb (82.101 kg)   Lab Results  Component Value Date   FREET4 1.63* 01/05/2014   FREET4 0.74* 11/04/2013   FREET4 0.82 07/28/2013   TSH 0.05* 01/05/2014   TSH 76.358* 11/04/2013   TSH 34.47* 07/28/2013     Past Medical History  Diagnosis Date  . ASCUS (atypical squamous cells of undetermined significance) on Pap smear 04/2010    NEG HPV  . GERD (gastroesophageal reflux disease)   . Hypothyroid     Past Surgical History  Procedure Laterality Date  . Tubal ligation    . Augmentation mammaplasty      Family History  Problem Relation Age of Onset  . Other Father     Unknown  . Healthy Sister   . Healthy Son   . Healthy Daughter     Social History:  reports that she has never smoked. She has never used smokeless tobacco. She reports that she drinks alcohol. She reports that she does not use illicit drugs.  Allergies: No Known Allergies    Medication List       This list is accurate as of: 01/07/14 10:57 AM.  Always use your most recent med list.               amphetamine-dextroamphetamine 20 MG tablet  Commonly  known as:  ADDERALL  Take 20 mg by mouth 4 (four) times daily.     levothyroxine 175 MCG tablet  Commonly known as:  SYNTHROID  Take 1 tablet (175 mcg total) by mouth daily before breakfast.     VITAMIN B 12 PO  Take 1 tablet by mouth daily.        Review of Systems:  She has less insomnia some late Evaluation for her myalgia showed normal studies including ESR, CPK not available           GASTROENTEROLOGY:  no Change in bowel habits, has had chronic severe constipation.      Sometimes her right ankle is very swollen       She has regular menstrual cycles She has mild hypercholesterolemia, last LDL 134   Examination:    BP 105/65  Pulse 62  Temp(Src) 97.9 F (36.6 C)  Resp 16  Ht _0  (1.626 m)   Wt 174 lb (78.926 kg)  BMI 29.85 kg/m2  SpO2 98%   Neurological: REFLEXES: at biceps show  normal relaxation     no tremor.  Assessments    Hypothyroidism, primary and long-standing. Her TSH levels have been very inconsistent Previously appears to have had a high TSH levels with taking her medication very regularly On her last visit she had marked hypothyroidism with high TSH level and was taking approximately 90 mg of levothyroxine a day on an average However with increasing her dose to 175 mcg she is now having subclinical hyperthyroidism Again not clear why she is having significant fatigue and leg pain since her symptoms are not much better  Pain in legs: May have an element of fibromyalgia, previously tried low dose nortriptyline Fatigue he is likely to be related to insomnia and she will need to followup with PCP  Treatment:   Will need to reduce her levothyroxine dose now Since her TSH was high with 125 mcg she will need a higher dose than that and will try 137 mcg She is going to try and take her medication it consistently now Followup in 2 months  [name] 01/07/2014, 10:57 AM

## 2014-01-07 NOTE — Telephone Encounter (Signed)
Pt called to cancel her 01/09/14 f/u appt with Dr. Allena KatzPatel. She is going to be out of town and will call later to r/s.

## 2014-01-09 ENCOUNTER — Ambulatory Visit: Payer: BC Managed Care – PPO | Admitting: Neurology

## 2014-01-29 ENCOUNTER — Other Ambulatory Visit: Payer: BC Managed Care – PPO

## 2014-02-03 ENCOUNTER — Other Ambulatory Visit: Payer: BC Managed Care – PPO

## 2014-02-11 ENCOUNTER — Telehealth: Payer: Self-pay | Admitting: Family Medicine

## 2014-02-11 NOTE — Telephone Encounter (Signed)
Pt would a referral to dr balan endocrinologist for tsh issues. Pt has bcbs

## 2014-02-11 NOTE — Telephone Encounter (Signed)
Pt does not want to go back to dr Lucianne Muss

## 2014-02-11 NOTE — Telephone Encounter (Signed)
I am confused as I think she is seeing an endocrinologist already? Is this an insurance change, location issues? Ok to place for hypothyroidism if needed. But does not need two endocrinologists and she should let Dr. Lucianne Muss know if she is switching to another office. Thanks.

## 2014-03-03 ENCOUNTER — Other Ambulatory Visit: Payer: BC Managed Care – PPO

## 2014-03-09 ENCOUNTER — Ambulatory Visit (INDEPENDENT_AMBULATORY_CARE_PROVIDER_SITE_OTHER): Payer: BC Managed Care – PPO | Admitting: Endocrinology

## 2014-03-09 ENCOUNTER — Encounter: Payer: Self-pay | Admitting: Endocrinology

## 2014-03-09 VITALS — BP 114/76 | HR 97 | Temp 98.2°F | Resp 16 | Ht 64.0 in | Wt 177.2 lb

## 2014-03-09 DIAGNOSIS — E039 Hypothyroidism, unspecified: Secondary | ICD-10-CM

## 2014-03-09 LAB — T4, FREE: Free T4: 0.75 ng/dL (ref 0.60–1.60)

## 2014-03-09 LAB — TSH: TSH: 6.26 u[IU]/mL — ABNORMAL HIGH (ref 0.35–4.50)

## 2014-03-09 NOTE — Progress Notes (Signed)
Patient ID: Joy Wilson, female   DOB: 06-22-1965, 48 y.o.   MRN: [id number]   Reason for Appointment:  Hypothyroidism    History of Present Illness:   The Hyothyroidism was first diagnosed in 2002   Previous history: At diagnosis she had noticed a rapid weight gain of about 30 pounds in 2 months.  She was given a thyroid supplement and she took them for 3-4 years with only modest benefit. She says that her weight did not consistently come down and she did not feel any better otherwise. She stopped the medication for 2-3 years on her own without experiencing symptoms. In 2008 when she was admitted for syncope she had significant hypothyroidism diagnosed and started taking Synthroid, initially 88 mcg and in 2012 this was increased to 125 mcg Until 12/14 she had been seeing a holistic physician who was given her unknown supplements and a weight loss program. Apparently her thyroid supplements were weaned off    Recent history:  She has had symptoms of fatigue and sleepiness as well as cord intolerance and difficulty losing weight  TSH was significantly high at 24.4 in 12/14 when she was off her thyroid supplements. She was then given 125 mcg of levothyroxine   However she has had increased TSH levels subsequently because of not very irregular with taking her medication Prior to her initial consultation she had been having marked problems with pains in her legs and difficulty moving around. She also did complain of persistent fatigue and some cold intolerance Because of her marked increase in TSH to 76 in 6/15 her dose was increased to 175 mcg Subsequently she started being more compliant with taking her medication daily before breakfast  However this caused her to have subclinical hyperthyroidism with high free T4 and suppressed TSH; at this time she was only complaining of fatigue and sleepiness He has been now taking 137 mcg daily, thyroid levels are pending She has had some decrease in  her leg pains and able to walk better now but still complains of significant sleepiness during the day  She tends to get both hot and cold easily         Wt Readings from Last 3 Encounters:  03/09/14 177 lb 3.2 oz (80.377 kg)  01/07/14 174 lb (78.926 kg)  12/10/13 183 lb (83.008 kg)   Lab Results  Component Value Date   FREET4 1.63* 01/05/2014   FREET4 0.74* 11/04/2013   FREET4 0.82 07/28/2013   TSH 0.05* 01/05/2014   TSH 76.358* 11/04/2013   TSH 34.47* 07/28/2013     Past Medical History  Diagnosis Date  . ASCUS (atypical squamous cells of undetermined significance) on Pap smear 04/2010    NEG HPV  . GERD (gastroesophageal reflux disease)   . Hypothyroid     Past Surgical History  Procedure Laterality Date  . Tubal ligation    . Augmentation mammaplasty      Family History  Problem Relation Age of Onset  . Other Father     Unknown  . Healthy Sister   . Healthy Son   . Healthy Daughter     Social History:  reports that she has never smoked. She has never used smokeless tobacco. She reports that she drinks alcohol. She reports that she does not use illicit drugs.  Allergies: No Known Allergies    Medication List       This list is accurate as of: 03/09/14 10:02 AM.  Always use your most recent med list.  ALPRAZolam 0.5 MG tablet  Commonly known as:  XANAX     amphetamine-dextroamphetamine 20 MG tablet  Commonly known as:  ADDERALL  Take 20 mg by mouth 4 (four) times daily.     levothyroxine 137 MCG tablet  Commonly known as:  SYNTHROID, LEVOTHROID  Take 1 tablet (137 mcg total) by mouth daily before breakfast.     VITAMIN B 12 PO  Take 1 tablet by mouth daily.        Review of Systems:   Evaluation for her myalgia showed normal studies including ESR, CPK not available  She thinks when she cuts down on her carbohydrates she feels better  She has had some problems with anxiety and occasional insomnia          GASTROENTEROLOGY:  no  Change in bowel habits, has had chronic severe constipation.       She has regular menstrual cycles and does have significant PMS  She has mild hypercholesterolemia, last LDL 134   Examination:    BP 114/76  Pulse 97  Temp(Src) 98.2 F (36.8 C)  Resp 16  Ht 5' 4"  (1.626 m)  Wt 177 lb 3.2 oz (80.377 kg)  BMI 30.40 kg/m2  SpO2 95%  Thyroid not palpable  Neurological: REFLEXES: at biceps show  normal to brisk relaxation    Skin appears normal.  Assessment/Plan    Hypothyroidism, primary and long-standing. Previously appears to have had a high TSH levels with taking her thyroid medication  regularly On her last visit she had mild subclinical hyperthyroidism with trying 175 mcg dose Now she is still having nonspecific symptoms of fatigue and somnolence as well as cord intolerance but clinically looks euthyroid Will check her thyroid levels today and decide on further management  Also discussed with patient that since she is having fatigue and somnolence for unclear reasons may give her a trial of Armour Thyroid instead of levothyroxine for a short time. The dose will depend on her current thyroid levels   Daytime somnolence: She will need to discuss with PCP, not clear if she could have sleep apnea  Followup in 2 months  Joy Wilson 03/09/2014, 10:02 AM

## 2014-03-09 NOTE — Progress Notes (Signed)
Quick Note:  Thyroid level slightly low. She will switch to Synthroid 137 to Armour Thyroid 120 mg daily, followup in 2 months ______

## 2014-03-10 ENCOUNTER — Other Ambulatory Visit: Payer: Self-pay | Admitting: *Deleted

## 2014-03-10 MED ORDER — THYROID 120 MG PO TABS: 120.0000 mg | ORAL_TABLET | Freq: Every day | ORAL | Status: DC

## 2014-03-11 NOTE — Telephone Encounter (Signed)
Ok to refer per her request. 

## 2014-03-12 ENCOUNTER — Other Ambulatory Visit: Payer: Self-pay | Admitting: *Deleted

## 2014-03-12 DIAGNOSIS — E039 Hypothyroidism, unspecified: Secondary | ICD-10-CM

## 2014-03-12 NOTE — Telephone Encounter (Signed)
I left a detailed message at the pts number someone from our office will give her a call with appt info.

## 2014-03-12 NOTE — Telephone Encounter (Signed)
Order placed for the referral to Dr Talmage NapBalan.

## 2014-04-06 ENCOUNTER — Encounter: Payer: Self-pay | Admitting: Endocrinology

## 2014-04-10 ENCOUNTER — Encounter: Payer: Self-pay | Admitting: Family Medicine

## 2014-06-08 ENCOUNTER — Other Ambulatory Visit: Payer: BC Managed Care – PPO

## 2014-06-09 ENCOUNTER — Ambulatory Visit: Payer: BC Managed Care – PPO | Admitting: Endocrinology

## 2014-06-11 ENCOUNTER — Ambulatory Visit: Payer: BC Managed Care – PPO | Admitting: Endocrinology

## 2014-06-23 ENCOUNTER — Telehealth: Payer: Self-pay | Admitting: Family Medicine

## 2014-06-23 NOTE — Telephone Encounter (Signed)
Pt need a referral to see Dr Talmage NapBalan. She had [employer] and now she has [employer]. She said she bought her new insurance card in on Friday 06/19/14

## 2014-06-29 ENCOUNTER — Ambulatory Visit (INDEPENDENT_AMBULATORY_CARE_PROVIDER_SITE_OTHER): Payer: Self-pay | Admitting: Family Medicine

## 2014-06-29 ENCOUNTER — Encounter: Payer: Self-pay | Admitting: Family Medicine

## 2014-06-29 VITALS — BP 118/82 | HR 88 | Temp 97.9°F | Ht 64.0 in | Wt 180.5 lb

## 2014-06-29 DIAGNOSIS — E039 Hypothyroidism, unspecified: Secondary | ICD-10-CM

## 2014-06-29 MED ORDER — THYROID 120 MG PO TABS: 120.0000 mg | ORAL_TABLET | Freq: Every day | ORAL | Status: AC

## 2014-06-29 MED ORDER — THYROID 120 MG PO TABS: 120.0000 mg | ORAL_TABLET | Freq: Every day | ORAL | Status: DC

## 2014-06-29 NOTE — Progress Notes (Signed)
Pre visit review using our clinic review tool, if applicable. No additional management support is needed unless otherwise documented below in the visit note. 

## 2014-06-29 NOTE — Addendum Note (Signed)
Addended by: Terressa KoyanagiKIM, Kenzee Bassin R on: 06/29/2014 08:23 AM   Modules accepted: Orders

## 2014-06-29 NOTE — Progress Notes (Addendum)
HPI:  Follow up:  Hypothyroidism: -managed by Dr, Lucianne MussKumar - then wanted to transfer to Dr. Talmage NapBalan - reports needed a new referral and now not seeing new doctor for a few months -she has a hx of stopping or not taking her medication, widely fluctuating levels, treatment with supplement only and now is on armour and did not follow up with endocrine as advised -reports felt much better on the amour thyroid with resolution of most symptoms including improvement in pains in legs -reports ran out of all of her medications last week -denies: hot/cold intol, palpitations, pain  Depression and anxiety: -sees a psychiatrist  Neck/L arm pain: -sees [facility] -s/p eval with neurologist but did not follow up, advised eval with rheum last visit as well for her generalized myalgias and paresthesia - more likely felt to be related to her wildly fluctuating thyroid levels and psychiatric illness    ROS: See pertinent positives and negatives per HPI.  Past Medical History  Diagnosis Date  . ASCUS (atypical squamous cells of undetermined significance) on Pap smear 04/2010    NEG HPV  . GERD (gastroesophageal reflux disease)   . Hypothyroid     Past Surgical History  Procedure Laterality Date  . Tubal ligation    . Augmentation mammaplasty      Family History  Problem Relation Age of Onset  . Other Father     Unknown  . Healthy Sister   . Healthy Son   . Healthy Daughter     History   Social History  . Marital Status: Married    Spouse Name: N/A    Number of Children: 3  . Years of Education: N/A   Social History Main Topics  . Smoking status: Never Smoker   . Smokeless tobacco: Never Used  . Alcohol Use: Yes     Comment: Rare  . Drug Use: No  . Sexual Activity: Yes    Birth Control/ Protection: Surgical   Other Topics Concern  . None   Social History Narrative   Work or School: homemaker      Home Situation: lives with husband, they have three children and 6 grand  children      Spiritual Beliefs: none      Lifestyle: no regular exercise; diet poor                 Current outpatient prescriptions:  .  amphetamine-dextroamphetamine (ADDERALL) 20 MG tablet, Take 20 mg by mouth 4 (four) times daily. , Disp: , Rfl:  .  Cyanocobalamin (VITAMIN B 12 PO), Take 1 tablet by mouth daily., Disp: , Rfl:  .  levothyroxine (SYNTHROID, LEVOTHROID) 137 MCG tablet, Take 1 tablet (137 mcg total) by mouth daily before breakfast., Disp: 30 tablet, Rfl: 1 .  thyroid (ARMOUR THYROID) 120 MG tablet, Take 1 tablet (120 mg total) by mouth daily before breakfast., Disp: 30 tablet, Rfl: 2  EXAM:  Filed Vitals:   06/29/14 0759  BP: 118/82  Pulse: 88  Temp: 97.9 F (36.6 C)    Body mass index is 30.97 kg/(m^2).  GENERAL: vitals reviewed and listed above, alert, oriented, appears well hydrated and in no acute distress  HEENT: atraumatic, conjunttiva clear, no obvious abnormalities on inspection of external nose and ears  NECK: no obvious masses on inspection  LUNGS: clear to auscultation bilaterally, no wheezes, rales or rhonchi, good air movement  CV: HRRR, no peripheral edema  MS: moves all extremities without noticeable abnormality  PSYCH: pleasant and  cooperative, no obvious depression or anxiety  ASSESSMENT AND PLAN:  Discussed the following assessment and plan:  Hypothyroidism, unspecified hypothyroidism type - Plan: DISCONTINUED: thyroid (ARMOUR THYROID) 120 MG tablet  -advised she needs to see endocrinologist for adjustments in her armour thyroid, refill given to get her to appointment -advised my assistant to help her with getting appt with Dr. Talmage Nap -Patient advised to return or notify a doctor immediately if symptoms worsen or persist or new concerns arise.  There are no Patient Instructions on file for this visit.   Kriste Basque R.

## 2014-06-30 ENCOUNTER — Telehealth: Payer: Self-pay | Admitting: *Deleted

## 2014-06-30 ENCOUNTER — Telehealth: Payer: Self-pay | Admitting: Family Medicine

## 2014-06-30 NOTE — Telephone Encounter (Signed)
I called Dr Willeen CassBalan's office and [facility] and she stated the pt had an appt on 06/08/14 and was a no-show and she will have to send a message to the physician to see if he will see the pt again.  I called the pt and informed her of this and she stated she did show up for the appt and they would not see her due to insurance changes and she left there crying!  I asked that she call Dr Willeen CassBalan's office and explain this [facility] in regards to her appt and she agreed.

## 2014-06-30 NOTE — Telephone Encounter (Signed)
Patient states you can call Dr. Talmage NapBalan office again.  She just spoke to them.

## 2015-01-07 ENCOUNTER — Ambulatory Visit (INDEPENDENT_AMBULATORY_CARE_PROVIDER_SITE_OTHER): Payer: 59 | Admitting: Family Medicine

## 2015-01-07 ENCOUNTER — Encounter: Payer: Self-pay | Admitting: Family Medicine

## 2015-01-07 VITALS — BP 120/80 | HR 69 | Temp 97.7°F | Wt 178.0 lb

## 2015-01-07 DIAGNOSIS — R202 Paresthesia of skin: Secondary | ICD-10-CM | POA: Diagnosis not present

## 2015-01-07 DIAGNOSIS — R208 Other disturbances of skin sensation: Secondary | ICD-10-CM

## 2015-01-07 MED ORDER — NORTRIPTYLINE HCL 10 MG PO CAPS: 10.0000 mg | ORAL_CAPSULE | Freq: Every day | ORAL | Status: AC

## 2015-01-07 NOTE — Progress Notes (Signed)
Subjective:    Patient ID: Joy Wilson, female    DOB: [birth year], 49 y.o.   MRN: [id number]  HPI Patient is seen today as a work in with very complex issue which is apparently not new and has been going on for at least a couple of years. She has seen neurology previously and has had extensive workup. She has episodic left-sided pain which she states radiates from "her head all the way to her toes" with associated numbness and hyperesthesias involving the left upper and lower extremity. She's also complaining of some left blurred vision and saw ophthalmologist just 3 weeks ago with apparently had an adjustment in her prescription. She states in the past these episodes seem to last anywhere from one and one half weeks to 3 months and are possibly triggered by stress. She has had MRI scans of both the brain and lumbar spine without demyelination or other significant acute abnormalities. She had extensive blood work looking for neuropathies- these were all basically unremarkable. She has hypothyroidism managed by endocrinology.  Recommendation for neurology was for EMG but patient apparently refused for fear of pain with the procedure. There had been suggestion of possible referral to pain management to evaluate for central sensitization pain syndrome  Past Medical History  Diagnosis Date  . ASCUS (atypical squamous cells of undetermined significance) on Pap smear 04/2010    NEG HPV  . GERD (gastroesophageal reflux disease)   . Hypothyroid    Past Surgical History  Procedure Laterality Date  . Tubal ligation    . Augmentation mammaplasty      reports that she has never smoked. She has never used smokeless tobacco. She reports that she drinks alcohol. She reports that she does not use illicit drugs. family history includes Healthy in her daughter, sister, and son; Other in her father. No Known Allergies    Review of Systems  Constitutional: Negative for fever, chills, appetite change and  unexpected weight change.  HENT: Negative for trouble swallowing.   Eyes: Positive for visual disturbance. Negative for photophobia and redness.  Respiratory: Negative for cough and shortness of breath.   Cardiovascular: Negative for chest pain.  Gastrointestinal: Negative for abdominal pain.  Endocrine: Negative for polydipsia and polyuria.  Genitourinary: Negative for dysuria.  Neurological: Positive for dizziness and numbness.  Psychiatric/Behavioral: Negative for confusion.       Objective:   Physical Exam  Constitutional: She is oriented to person, place, and time. She appears well-developed and well-nourished.  Eyes: Pupils are equal, round, and reactive to light.  Fundi benign  Neck: Neck supple. No thyromegaly present.  Cardiovascular: Normal rate and regular rhythm.   Pulmonary/Chest: Effort normal and breath sounds normal. No respiratory distress. She has no wheezes. She has no rales.  Musculoskeletal: She exhibits no edema.  Lymphadenopathy:    She has no cervical adenopathy.  Neurological: She is alert and oriented to person, place, and time. No cranial nerve deficit.  No focal strength deficits. With monofilament testing she has decreased sensory dorsum left foot and dorsal left forearm compared to the right. Otherwise she seems to have hyperesthesia in testing several other areas. She is noted to have mild horizontal nystagmus to the right. No vertical nystagmus.          Assessment & Plan:  Patient presents with history of some chronic left-sided pain and paresthesias/dysesthesias/hyperesthesia which does not fit a clear dermatome or physiologic pattern. ?central sensitization pain syndrome.  She's had extensive workup as above.  She does have some pain symptoms and not clear she gave the Nortriptyline a fair trial and have recommended starting back 10 mg po qhs.  Will consult with neurology, though not sure they will have anything to add if she is continuing to  refuse the nerve conduction studies.

## 2015-01-07 NOTE — Progress Notes (Signed)
Pre visit review using our clinic review tool, if applicable. No additional management support is needed unless otherwise documented below in the visit note. 

## 2015-02-10 ENCOUNTER — Emergency Department (HOSPITAL_COMMUNITY): Payer: 59

## 2015-02-10 ENCOUNTER — Emergency Department (HOSPITAL_COMMUNITY)
Admission: EM | Admit: 2015-02-10 | Discharge: 2015-02-10 | Disposition: A | Discharge status: Home / Self Care | Payer: 59 | Attending: Emergency Medicine | Admitting: Emergency Medicine

## 2015-02-10 ENCOUNTER — Encounter (HOSPITAL_COMMUNITY): Payer: Self-pay | Admitting: *Deleted

## 2015-02-10 DIAGNOSIS — Y939 Activity, unspecified: Secondary | ICD-10-CM | POA: Diagnosis not present

## 2015-02-10 DIAGNOSIS — Z79899 Other long term (current) drug therapy: Secondary | ICD-10-CM | POA: Insufficient documentation

## 2015-02-10 DIAGNOSIS — Y999 Unspecified external cause status: Secondary | ICD-10-CM | POA: Diagnosis not present

## 2015-02-10 DIAGNOSIS — E039 Hypothyroidism, unspecified: Secondary | ICD-10-CM | POA: Diagnosis not present

## 2015-02-10 DIAGNOSIS — Y929 Unspecified place or not applicable: Secondary | ICD-10-CM | POA: Diagnosis not present

## 2015-02-10 DIAGNOSIS — X58XXXA Exposure to other specified factors, initial encounter: Secondary | ICD-10-CM | POA: Insufficient documentation

## 2015-02-10 DIAGNOSIS — Z791 Long term (current) use of non-steroidal anti-inflammatories (NSAID): Secondary | ICD-10-CM | POA: Diagnosis not present

## 2015-02-10 DIAGNOSIS — S39012A Strain of muscle, fascia and tendon of lower back, initial encounter: Secondary | ICD-10-CM

## 2015-02-10 DIAGNOSIS — Z8719 Personal history of other diseases of the digestive system: Secondary | ICD-10-CM | POA: Diagnosis not present

## 2015-02-10 DIAGNOSIS — M62830 Muscle spasm of back: Secondary | ICD-10-CM | POA: Insufficient documentation

## 2015-02-10 DIAGNOSIS — R112 Nausea with vomiting, unspecified: Secondary | ICD-10-CM | POA: Insufficient documentation

## 2015-02-10 DIAGNOSIS — M62838 Other muscle spasm: Secondary | ICD-10-CM

## 2015-02-10 DIAGNOSIS — M545 Low back pain: Secondary | ICD-10-CM | POA: Diagnosis present

## 2015-02-10 LAB — CBC WITH DIFFERENTIAL/PLATELET
BASOS ABS: 0 10*3/uL (ref 0.0–0.1)
BASOS PCT: 1 % (ref 0–1)
Eosinophils Absolute: 0.2 10*3/uL (ref 0.0–0.7)
Eosinophils Relative: 3 % (ref 0–5)
HEMATOCRIT: 42.6 % (ref 36.0–46.0)
HEMOGLOBIN: 14.5 g/dL (ref 12.0–15.0)
Lymphocytes Relative: 23 % (ref 12–46)
Lymphs Abs: 1.9 10*3/uL (ref 0.7–4.0)
MCH: 29.4 pg (ref 26.0–34.0)
MCHC: 34 g/dL (ref 30.0–36.0)
MCV: 86.4 fL (ref 78.0–100.0)
MONOS PCT: 6 % (ref 3–12)
Monocytes Absolute: 0.5 10*3/uL (ref 0.1–1.0)
NEUTROS ABS: 5.4 10*3/uL (ref 1.7–7.7)
NEUTROS PCT: 67 % (ref 43–77)
Platelets: 300 10*3/uL (ref 150–400)
RBC: 4.93 MIL/uL (ref 3.87–5.11)
RDW: 12.2 % (ref 11.5–15.5)
WBC: 8 10*3/uL (ref 4.0–10.5)

## 2015-02-10 LAB — URINE MICROSCOPIC-ADD ON

## 2015-02-10 LAB — URINALYSIS, ROUTINE W REFLEX MICROSCOPIC
BILIRUBIN URINE: NEGATIVE
Glucose, UA: NEGATIVE mg/dL
Ketones, ur: NEGATIVE mg/dL
Nitrite: NEGATIVE
PH: 7 (ref 5.0–8.0)
Protein, ur: NEGATIVE mg/dL
SPECIFIC GRAVITY, URINE: 1.01 (ref 1.005–1.030)
Urobilinogen, UA: 0.2 mg/dL (ref 0.0–1.0)

## 2015-02-10 LAB — COMPREHENSIVE METABOLIC PANEL
ALBUMIN: 4.3 g/dL (ref 3.5–5.0)
ALK PHOS: 104 U/L (ref 38–126)
ALT: 65 U/L — AB (ref 14–54)
AST: 73 U/L — AB (ref 15–41)
Anion gap: 9 (ref 5–15)
BILIRUBIN TOTAL: 0.6 mg/dL (ref 0.3–1.2)
BUN: 15 mg/dL (ref 6–20)
CALCIUM: 9.4 mg/dL (ref 8.9–10.3)
CO2: 25 mmol/L (ref 22–32)
Chloride: 103 mmol/L (ref 101–111)
Creatinine, Ser: 0.82 mg/dL (ref 0.44–1.00)
GFR calc Af Amer: >60 mL/min (ref >60)
GFR calc non Af Amer: >60 mL/min (ref >60)
GLUCOSE: 124 mg/dL — AB (ref 65–99)
Potassium: 4 mmol/L (ref 3.5–5.1)
Sodium: 137 mmol/L (ref 135–145)
TOTAL PROTEIN: 8.1 g/dL (ref 6.5–8.1)

## 2015-02-10 LAB — LIPASE, BLOOD: Lipase: 15 U/L — ABNORMAL LOW (ref 22–51)

## 2015-02-10 MED ORDER — SODIUM CHLORIDE 0.9 % IV BOLUS (SEPSIS)
1000.0000 mL | Freq: Once | INTRAVENOUS | Status: AC
  Administered 2015-02-10: 1000 mL via INTRAVENOUS

## 2015-02-10 MED ORDER — DIAZEPAM 5 MG PO TABS: 5.0000 mg | ORAL_TABLET | Freq: Four times a day (QID) | ORAL | Status: DC | PRN

## 2015-02-10 MED ORDER — IBUPROFEN 800 MG PO TABS: 800.0000 mg | ORAL_TABLET | Freq: Three times a day (TID) | ORAL | Status: DC

## 2015-02-10 MED ORDER — HYDROMORPHONE HCL 1 MG/ML IJ SOLN
1.0000 mg | Freq: Once | INTRAMUSCULAR | Status: AC
  Administered 2015-02-10: 1 mg via INTRAVENOUS
  Filled 2015-02-10: qty 1

## 2015-02-10 MED ORDER — ONDANSETRON HCL 4 MG/2ML IJ SOLN
4.0000 mg | Freq: Once | INTRAMUSCULAR | Status: AC
  Administered 2015-02-10: 4 mg via INTRAVENOUS
  Filled 2015-02-10: qty 2

## 2015-02-10 MED ORDER — OXYCODONE-ACETAMINOPHEN 5-325 MG PO TABS: 1.0000 | ORAL_TABLET | ORAL | Status: DC | PRN

## 2015-02-10 NOTE — ED Notes (Signed)
Mid-lower back pain began last week. N/V

## 2015-02-10 NOTE — Discharge Instructions (Signed)
Back Pain, Adult °Low back pain is very common. About 1 in 5 people have back pain. The cause of low back pain is rarely dangerous. The pain often gets better over time. About half of people with a sudden onset of back pain feel better in just 2 weeks. About 8 in 10 people feel better by 6 weeks.  °CAUSES °Some common causes of back pain include: °· Strain of the muscles or ligaments supporting the spine. °· Wear and tear (degeneration) of the spinal discs. °· Arthritis. °· Direct injury to the back. °DIAGNOSIS °Most of the time, the direct cause of low back pain is not known. However, back pain can be treated effectively even when the exact cause of the pain is unknown. Answering your caregiver's questions about your overall health and symptoms is one of the most accurate ways to make sure the cause of your pain is not dangerous. If your caregiver needs more information, he or she may order lab work or imaging tests (X-rays or MRIs). However, even if imaging tests show changes in your back, this usually does not require surgery. °HOME CARE INSTRUCTIONS °For many people, back pain returns. Since low back pain is rarely dangerous, it is often a condition that people can learn to manage on their own.  °· Remain active. It is stressful on the back to sit or stand in one place. Do not sit, drive, or stand in one place for more than 30 minutes at a time. Take short walks on level surfaces as soon as pain allows. Try to increase the length of time you walk each day. °· Do not stay in bed. Resting more than 1 or 2 days can delay your recovery. °· Do not avoid exercise or work. Your body is made to move. It is not dangerous to be active, even though your back may hurt. Your back will likely heal faster if you return to being active before your pain is gone. °· Pay attention to your body when you  bend and lift. Many people have less discomfort when lifting if they bend their knees, keep the load close to their bodies, and  avoid twisting. Often, the most comfortable positions are those that put less stress on your recovering back. °· Find a comfortable position to sleep. Use a firm mattress and lie on your side with your knees slightly bent. If you lie on your back, put a pillow under your knees. °· Only take over-the-counter or prescription medicines as directed by your caregiver. Over-the-counter medicines to reduce pain and inflammation are often the most helpful. Your caregiver may prescribe muscle relaxant drugs. These medicines help dull your pain so you can more quickly return to your normal activities and healthy exercise. °· Put ice on the injured area. °· Put ice in a plastic bag. °· Place a towel between your skin and the bag. °· Leave the ice on for 15-20 minutes, 03-04 times a day for the first 2 to 3 days. After that, ice and heat may be alternated to reduce pain and spasms. °· Ask your caregiver about trying back exercises and gentle massage. This may be of some benefit. °· Avoid feeling anxious or stressed. Stress increases muscle tension and can worsen back pain. It is important to recognize when you are anxious or stressed and learn ways to manage it. Exercise is a great option. °SEEK MEDICAL CARE IF: °· You have pain that is not relieved with rest or medicine. °· You have pain that does not improve in 1 week. °· You have new symptoms. °· You are generally not feeling well. °SEEK   IMMEDIATE MEDICAL CARE IF:  °· You have pain that radiates from your back into your legs. °· You develop new bowel or bladder control problems. °· You have unusual weakness or numbness in your arms or legs. °· You develop nausea or vomiting. °· You develop abdominal pain. °· You feel faint. °Document Released: 05/22/2005 Document Revised: 11/21/2011 Document Reviewed: 09/23/2013 °ExitCare® Patient Information ©2015 [employer]. This information is not intended to replace advice given to you by your health care provider. Make sure you  discuss any questions you have with your health care provider. ° °Muscle Cramps and Spasms °Muscle cramps and spasms occur when a muscle or muscles tighten and you have no control over this tightening (involuntary muscle contraction). They are a common problem and can develop in any muscle. The most common place is in the calf muscles of the leg. Both muscle cramps and muscle spasms are involuntary muscle contractions, but they also have differences:  °· Muscle cramps are sporadic and painful. They may last a few seconds to a quarter of an hour. Muscle cramps are often more forceful and last longer than muscle spasms. °· Muscle spasms may or may not be painful. They may also last just a few seconds or much longer. °CAUSES  °It is uncommon for cramps or spasms to be due to a serious underlying problem. In many cases, the cause of cramps or spasms is unknown. Some common causes are:  °· Overexertion.   °· Overuse from repetitive motions (doing the same thing over and over).   °· Remaining in a certain position for a long period of time.   °· Improper preparation, form, or technique while performing a sport or activity.   °· Dehydration.   °· Injury.   °· Side effects of some medicines.   °· Abnormally low levels of the salts and ions in your blood (electrolytes), especially potassium and calcium. This could happen if you are taking water pills (diuretics) or you are pregnant.   °Some underlying medical problems can make it more likely to develop cramps or spasms. These include, but are not limited to:  °· Diabetes.   °· Parkinson disease.   °· Hormone disorders, such as thyroid problems.   °· Alcohol abuse.   °· Diseases specific to muscles, joints, and bones.   °· Blood vessel disease where not enough blood is getting to the muscles.   °HOME CARE INSTRUCTIONS  °· Stay well hydrated. Drink enough water and fluids to keep your urine clear or pale yellow. °· It may be helpful to massage, stretch, and relax the affected  muscle. °· For tight or tense muscles, use a warm towel, heating pad, or hot shower water directed to the affected area. °· If you are sore or have pain after a cramp or spasm, applying ice to the affected area may relieve discomfort. °¨ Put ice in a plastic bag. °¨ Place a towel between your skin and the bag. °¨ Leave the ice on for 15-20 minutes, 03-04 times a day. °· Medicines used to treat a known cause of cramps or spasms may help reduce their frequency or severity. Only take over-the-counter or prescription medicines as directed by your caregiver. °SEEK MEDICAL CARE IF:  °Your cramps or spasms get more severe, more frequent, or do not improve over time.  °MAKE SURE YOU:  °· Understand these instructions. °· Will watch your condition. °· Will get help right away if you are not doing well or get worse. °Document Released: 11/11/2001 Document Revised: 09/16/2012 Document Reviewed: 05/08/2012 °ExitCare® Patient Information ©2015 [employer].   This information is not intended to replace advice given to you by your health care provider. Make sure you discuss any questions you have with your health care provider. ° °

## 2015-02-10 NOTE — ED Provider Notes (Signed)
CSN: [id number]     Arrival date & time 02/10/15  1106 History  This chart was scribed for No att. providers found by Marica Otter, ED Scribe. This patient was seen in room APA01/APA01 and the patient's care was started at 11:18 AM.   Chief Complaint  Patient presents with  . Back Pain   The history is provided by the patient. No language interpreter was used.   PCP: Terressa Koyanagi., DO HPI Comments: Joy Wilson is a 49 y.o. female, with PMHx noted below, who presents to the Emergency Department complaining of atraumatic, sudden onset, worsening, 10/10, burning bilateral lumbar pain with associated n/v onset last week and worsening over the past 3 days. Pt equates the pain to labor contractions and notes it persists regardless of what she does. Pt denies any urinary Sx.   Past Medical History  Diagnosis Date  . ASCUS (atypical squamous cells of undetermined significance) on Pap smear 04/2010    NEG HPV  . GERD (gastroesophageal reflux disease)   . Hypothyroid    Past Surgical History  Procedure Laterality Date  . Tubal ligation    . Augmentation mammaplasty     Family History  Problem Relation Age of Onset  . Other Father     Unknown  . Healthy Sister   . Healthy Son   . Healthy Daughter    Social History  Substance Use Topics  . Smoking status: Never Smoker   . Smokeless tobacco: Never Used  . Alcohol Use: Yes     Comment: Rare   OB History    Gravida Para Term Preterm AB TAB SAB Ectopic Multiple Living   Review of Systems  Constitutional: Negative for fever and chills.  Gastrointestinal: Positive for nausea and vomiting.  Musculoskeletal: Positive for back pain.      Allergies  Review of patient's allergies indicates no known allergies.  Home Medications   Prior to Admission medications   Medication Sig Start Date End Date Taking? Authorizing Provider  acetaminophen (TYLENOL) 500 MG tablet Take 1,000 mg by mouth every 6 (six) hours as  needed for moderate pain.   Yes Historical Provider, MD  ALPRAZolam Prudy Feeler) 0.25 MG tablet Take 0.25 mg by mouth at bedtime as needed for anxiety or sleep.   Yes Historical Provider, MD  amphetamine-dextroamphetamine (ADDERALL XR) 30 MG 24 hr capsule Take 30 mg by mouth daily.   Yes Historical Provider, MD  amphetamine-dextroamphetamine (ADDERALL) 20 MG tablet Take 10-20 mg by mouth 3 (three) times daily.    Yes Historical Provider, MD  BIOTIN PO Take by mouth.   Yes Historical Provider, MD  Cholecalciferol (VITAMIN D PO) Take 1 tablet by mouth daily.   Yes Historical Provider, MD  Cyanocobalamin (VITAMIN B 12 PO) Take 1 tablet by mouth daily.   Yes Historical Provider, MD  naproxen sodium (ANAPROX) 220 MG tablet Take 220-440 mg by mouth 2 (two) times daily with a meal. 440 mg in the morning and 220 mg at night.   Yes Historical Provider, MD  nortriptyline (PAMELOR) 10 MG capsule Take 1 capsule (10 mg total) by mouth at bedtime. 01/07/15  Yes Kristian Covey, MD  thyroid [facility] THYROID) 120 MG tablet Take 1 tablet (120 mg total) by mouth daily before breakfast. 06/29/14  Yes Terressa Koyanagi, DO  diazepam (VALIUM) 5 MG tablet Take 1 tablet (5 mg total) by mouth every 6 (six) hours  as needed for anxiety (spasms). 02/10/15   Gilda Crease, MD  ibuprofen (ADVIL,MOTRIN) 800 MG tablet Take 1 tablet (800 mg total) by mouth 3 (three) times daily. 02/10/15   Gilda Crease, MD  oxyCODONE-acetaminophen (PERCOCET) 5-325 MG per tablet Take 1-2 tablets by mouth every 4 (four) hours as needed. 02/10/15   Gilda Crease, MD   Triage Vitals: BP 119/86 mmHg  Pulse 119  Temp(Src) 97.8 F (36.6 C) (Oral)  Resp 20  Ht 5\' 1"  (1.549 m)  Wt 170 lb (77.111 kg)  BMI 32.14 kg/m2  SpO2 97%  LMP 01/18/2015 Physical Exam  Constitutional: She is oriented to person, place, and time. She appears well-developed and well-nourished. She appears distressed.  HENT:  Head: Normocephalic and atraumatic.  Right  Ear: Hearing normal.  Left Ear: Hearing normal.  Nose: Nose normal.  Mouth/Throat: Oropharynx is clear and moist and mucous membranes are normal.  Eyes: Conjunctivae and EOM are normal. Pupils are equal, round, and reactive to light.  Neck: Normal range of motion. Neck supple.  Cardiovascular: Regular rhythm, S1 normal and S2 normal.  Exam reveals no gallop and no friction rub.   No murmur heard. Pulmonary/Chest: Effort normal and breath sounds normal. No respiratory distress. She exhibits no tenderness.  Abdominal: Soft. Normal appearance and bowel sounds are normal. There is no hepatosplenomegaly. There is no tenderness. There is no rebound, no guarding, no tenderness at McBurney's point and negative Murphy's sign. No hernia.  Musculoskeletal: Normal range of motion.       Lumbar back: She exhibits tenderness and spasm.  Neurological: She is alert and oriented to person, place, and time. She has normal strength. No cranial nerve deficit or sensory deficit. Coordination normal. GCS eye subscore is 4. GCS verbal subscore is 5. GCS motor subscore is 6.  Skin: Skin is warm, dry and intact. No rash noted. No cyanosis.  Psychiatric: Her speech is normal. Thought content normal.  Nursing note and vitals reviewed.   ED Course  Procedures (including critical care time) DIAGNOSTIC STUDIES: Oxygen Saturation is 97% on RA, nl by my interpretation.    COORDINATION OF CARE: 11:20 AM-Discussed treatment plan which includes labs with pt at bedside and pt agreed to plan.   Labs Review Labs Reviewed  COMPREHENSIVE METABOLIC PANEL - Abnormal; Notable for the following:    Glucose, Bld 124 (*)    AST 73 (*)    ALT 65 (*)    All other components within normal limits  LIPASE, BLOOD - Abnormal; Notable for the following:    Lipase 15 (*)    All other components within normal limits  URINALYSIS, ROUTINE W REFLEX MICROSCOPIC (NOT AT [facility]) - Abnormal; Notable for the following:    Color, Urine STRAW (*)     Hgb urine dipstick TRACE (*)    Leukocytes, UA TRACE (*)    All other components within normal limits  URINE MICROSCOPIC-ADD ON - Abnormal; Notable for the following:    Squamous Epithelial / LPF MANY (*)    All other components within normal limits  CBC WITH DIFFERENTIAL/PLATELET    Imaging Review Ct Renal Stone Study  02/10/2015   CLINICAL DATA:  Acute left flank pain.  EXAM: CT ABDOMEN AND PELVIS WITHOUT CONTRAST  TECHNIQUE: Multidetector CT imaging of the abdomen and pelvis was performed following the standard protocol without IV contrast.  COMPARISON:  CT scan of June 19, 2013.  FINDINGS: Visualized lung bases appear normal. No significant osseous abnormality is noted.  No gallstones are noted. The liver, spleen and pancreas appear normal. Adrenal glands and kidneys appear normal. No hydronephrosis or renal obstruction is noted. No renal or ureteral calculi are noted. There is no evidence of bowel obstruction. The appendix appears normal. Uterus and ovaries appear normal. Urinary bladder is decompressed. No abnormal fluid collection is noted. No significant adenopathy is noted.  IMPRESSION: No acute abnormality seen in the abdomen or pelvis.   Electronically Signed   By: Lupita Raider, M.D.   On: 02/10/2015 12:56   I have personally reviewed and evaluated these images and lab results as part of my medical decision-making.   EKG Interpretation None      MDM   Final diagnoses:  Lumbar strain, initial encounter  Muscle spasm    Presents to the ER for evaluation of back pain. Patient reports that she has had progressively worsening pain in her lower back. It started on the left side, but now is both sides. Pain is sharp, stabbing and severe. It worsens with movement. She denies injury. Neurologic examination is unremarkable, no signs of neurologic deficit. Patient much improved after analgesia. Workup negative.  I personally performed the services described in this  documentation, which was scribed in my presence. The recorded information has been reviewed and is accurate.   Gilda Crease, MD 02/10/15 1530

## 2015-03-09 ENCOUNTER — Ambulatory Visit (INDEPENDENT_AMBULATORY_CARE_PROVIDER_SITE_OTHER): Payer: 59 | Admitting: Family Medicine

## 2015-03-09 ENCOUNTER — Encounter: Payer: Self-pay | Admitting: Family Medicine

## 2015-03-09 VITALS — BP 102/76 | HR 102 | Temp 98.2°F | Ht 61.0 in | Wt 183.5 lb

## 2015-03-09 DIAGNOSIS — Z23 Encounter for immunization: Secondary | ICD-10-CM

## 2015-03-09 DIAGNOSIS — M545 Low back pain, unspecified: Secondary | ICD-10-CM

## 2015-03-09 MED ORDER — TIZANIDINE HCL 2 MG PO CAPS: 2.0000 mg | ORAL_CAPSULE | Freq: Two times a day (BID) | ORAL | Status: AC | PRN

## 2015-03-09 MED ORDER — IBUPROFEN 800 MG PO TABS: 800.0000 mg | ORAL_TABLET | Freq: Two times a day (BID) | ORAL | Status: AC | PRN

## 2015-03-09 NOTE — Progress Notes (Signed)
HPI:  Joy Wilson is a 49 yo F with a PMH sig for hypothyroidism (managed by her endocrinologist), psychiatric illness (managed by her psychiatrist) and chronic multiple somatic complains of pain and paresthesias (s/p eval with neuro, now followed by [facility]) here for an acute visit for:  Lumbar back pain: -ED eval 02/10/15 and felt to be lumbar strain - reports this was due to redoing bathroom floor -she reports: did better with muscle relaxer and HEP and prescription strength ibuprofen and issues resolved - then reinjured with lifting laundry basket yesterda -currently has bilat low back and mid back muscle spasm and mod pain, worse with certain movements -had CT and MRI lumbar spine in 2015 for low back pain with radicular symptoms with lumbar spndylosis without neural foraminal narrowing or sig canal stenosis -denies: radiation of pain to extremities, fevers, malaise, weakness or numbness in LEs, bowel or bladder incontinence   Chronic issues:  Depression and anxiety: -sees a psychiatrist  Neck/L arm pain/back pain/chronic myalgias and paresthesias: -sees [facility] -s/p eval with extensive eval with neurologist (Dr. Allena Katz w/ MRI brain and spine, extensive labs and optho eval) - she refused EMG, referred back to neuro by my collegue 01/2015 -advised eval with rheum or pain management in the past   Hypothyroidism: -managed by her endocrinologist   ROS: See pertinent positives and negatives per HPI.  Past Medical History  Diagnosis Date  . ASCUS (atypical squamous cells of undetermined significance) on Pap smear 04/2010    NEG HPV  . GERD (gastroesophageal reflux disease)   . Hypothyroid     Past Surgical History  Procedure Laterality Date  . Tubal ligation    . Augmentation mammaplasty      Family History  Problem Relation Age of Onset  . Other Father     Unknown  . Healthy Sister   . Healthy Son   . Healthy Daughter     Social History   Social  History  . Marital Status: Married    Spouse Name: N/A  . Number of Children: 3  . Years of Education: N/A   Social History Main Topics  . Smoking status: Never Smoker   . Smokeless tobacco: Never Used  . Alcohol Use: Yes     Comment: Rare  . Drug Use: No  . Sexual Activity: Yes    Birth Control/ Protection: Surgical   Other Topics Concern  . None   Social History Narrative   Work or School: homemaker      Home Situation: lives with husband, they have three children and 6 grand children      Spiritual Beliefs: none      Lifestyle: no regular exercise; diet poor                 Current outpatient prescriptions:  .  ALPRAZolam (XANAX) 0.25 MG tablet, Take 0.25 mg by mouth at bedtime as needed for anxiety or sleep., Disp: , Rfl:  .  amphetamine-dextroamphetamine (ADDERALL XR) 30 MG 24 hr capsule, Take 30 mg by mouth daily., Disp: , Rfl:  .  amphetamine-dextroamphetamine (ADDERALL) 20 MG tablet, Take 10-20 mg by mouth 3 (three) times daily. , Disp: , Rfl:  .  BIOTIN PO, Take by mouth., Disp: , Rfl:  .  Cholecalciferol (VITAMIN D PO), Take 1 tablet by mouth daily., Disp: , Rfl:  .  Cyanocobalamin (VITAMIN B 12 PO), Take 1 tablet by mouth daily., Disp: , Rfl:  .  ibuprofen (ADVIL,MOTRIN) 800 MG tablet,  Take 1 tablet (800 mg total) by mouth 2 (two) times daily as needed., Disp: 14 tablet, Rfl: 0 .  nortriptyline (PAMELOR) 10 MG capsule, Take 1 capsule (10 mg total) by mouth at bedtime., Disp: 30 capsule, Rfl: 1 .  thyroid (ARMOUR THYROID) 120 MG tablet, Take 1 tablet (120 mg total) by mouth daily before breakfast., Disp: 30 tablet, Rfl: 2 .  tizanidine (ZANAFLEX) 2 MG capsule, Take 1 capsule (2 mg total) by mouth 2 (two) times daily as needed for muscle spasms., Disp: 20 capsule, Rfl: 0  EXAM:  Filed Vitals:   03/09/15 1056  BP: 102/76  Pulse: 102  Temp: 98.2 F (36.8 C)    Body mass index is 34.69 kg/(m^2).  GENERAL: vitals reviewed and listed above, alert,  oriented, appears well hydrated and in no acute distress  HEENT: atraumatic, conjunttiva clear, no obvious abnormalities on inspection of external nose and ears  NECK: no obvious masses on inspection  LUNGS: clear to auscultation bilaterally, no wheezes, rales or rhonchi, good air movement  CV: HRRR, no peripheral edema  MS/NEURO: moves all extremities without noticeable abnormality, gait normal Normal Gait Normal inspection of back, no obvious scoliosis or leg length descrepancy No bony TTP Soft tissue TTP at: bilat lumbar paraspinal muscles even with light touch -/+ tests: neg trendelenburg,-facet loading, -SLRT, -CLRT, -FABER, -FADIR Normal muscle strength, sensation to light touch and DTRs in LEs bilaterally  PSYCH: pleasant and cooperative, no obvious depression or anxiety  ASSESSMENT AND PLAN:  Discussed the following assessment and plan:  Bilateral low back pain without sciatica  -suspect muscle strain with likely underlying pain/somatic disorder and/or fibromyalgia, seeing psych, ortho and neuro for chronic issues -for acute strain advised HEP, short course nsaid - risks discussed with tylenol (in safe dosing levels)and/or topical capsaicin if needing more pain control, HEP, muscle relaxer -discussed physiatry or pain management if symptoms persist -follow up in 4 weeks  -Patient advised to return or notify a doctor immediately if symptoms worsen or persist or new concerns arise.  There are no Patient Instructions on file for this visit.   Kriste Basque R.

## 2015-03-09 NOTE — Progress Notes (Signed)
Pre visit review using our clinic review tool, if applicable. No additional management support is needed unless otherwise documented below in the visit note. 

## 2015-03-09 NOTE — Patient Instructions (Signed)
BEFORE YOU LEAVE: -flu shot -low back exercises -follow up in 4 weeks  Do the exercises at least 4 days per week  Use the ibuprofen and the muscle relaxers as instructed  Can use tylenol if needed later in the day 500-1000mg  up to twice daily  Topical sports cream with capsaicin and heat may also help

## 2015-04-02 ENCOUNTER — Other Ambulatory Visit: Payer: Self-pay | Admitting: Ophthalmology

## 2015-04-02 DIAGNOSIS — H547 Unspecified visual loss: Secondary | ICD-10-CM

## 2015-04-06 ENCOUNTER — Ambulatory Visit (INDEPENDENT_AMBULATORY_CARE_PROVIDER_SITE_OTHER): Payer: Self-pay | Admitting: Family Medicine

## 2015-04-06 DIAGNOSIS — R69 Illness, unspecified: Secondary | ICD-10-CM

## 2015-04-06 NOTE — Progress Notes (Signed)
No show

## 2015-04-12 ENCOUNTER — Ambulatory Visit: Payer: 59 | Admitting: Neurology

## 2015-04-15 ENCOUNTER — Ambulatory Visit: Payer: 59 | Admitting: Family Medicine

## 2015-04-16 ENCOUNTER — Encounter: Payer: Self-pay | Admitting: Family Medicine

## 2015-04-16 ENCOUNTER — Ambulatory Visit (INDEPENDENT_AMBULATORY_CARE_PROVIDER_SITE_OTHER): Payer: 59 | Admitting: Family Medicine

## 2015-04-16 VITALS — BP 120/80 | HR 102 | Temp 98.6°F | Ht 61.0 in | Wt 175.5 lb

## 2015-04-16 DIAGNOSIS — M545 Low back pain: Secondary | ICD-10-CM | POA: Diagnosis not present

## 2015-04-16 DIAGNOSIS — J0100 Acute maxillary sinusitis, unspecified: Secondary | ICD-10-CM | POA: Diagnosis not present

## 2015-04-16 MED ORDER — DOXYCYCLINE HYCLATE 100 MG PO TABS: 100.0000 mg | ORAL_TABLET | Freq: Two times a day (BID) | ORAL | Status: DC

## 2015-04-16 NOTE — Patient Instructions (Signed)
Please start the antibiotic for the sinus infection.  INSTRUCTIONS FOR UPPER RESPIRATORY INFECTION:  -plenty of rest and fluids  -nasal saline wash 2-3 times daily (use prepackaged nasal saline or bottled/distilled water if making your own)   -can use AFRIN nasal spray for drainage and nasal congestion - but do NOT use longer then 3-4 days  -can use tylenol (in no history of liver disease) or ibuprofen (if no history of kidney disease, bowel bleeding or significant heart disease) as directed for aches and sorethroat  -in the winter time, using a humidifier at night is helpful (please follow cleaning instructions)  -if you are taking a cough medication - use only as directed, may also try a teaspoon of honey to coat the throat and throat lozenges. If given a cough medication with codeine or hydrocodone or other narcotic please be advised that this contains a strong and  potentially addicting medication. Please follow instructions carefully, take as little as possible and only use AS NEEDED for severe cough. Discuss potential side effects with your pharmacy. Please do not drive or operate machinery while taking these types of medications. Please do not take other sedating medications, drugs or alcohol while taking this medication without discussing with your doctor.  -for sore throat, salt water gargles can help  -follow up if you have fevers, facial pain, tooth pain, difficulty breathing or are worsening or symptoms persist longer then expected

## 2015-04-16 NOTE — Progress Notes (Signed)
Pre visit review using our clinic review tool, if applicable. No additional management support is needed unless otherwise documented below in the visit note. 

## 2015-04-16 NOTE — Progress Notes (Signed)
HPI:  Acute visit for:  Follow up back pain: -this resolved with treatments from last visit, no recurrence  URI: -started 3 weeks ago, seemed to get better then worse this week -thick sinus congestion, R maxillary facial pain, sore throat, drainage and cough -denies: fevers, NVD, SOB -hx sinusitis -has tried OTC treatments  ROS: See pertinent positives and negatives per HPI.  Past Medical History  Diagnosis Date  . ASCUS (atypical squamous cells of undetermined significance) on Pap smear 04/2010    NEG HPV  . GERD (gastroesophageal reflux disease)   . Hypothyroid     Past Surgical History  Procedure Laterality Date  . Tubal ligation    . Augmentation mammaplasty      Family History  Problem Relation Age of Onset  . Other Father     Unknown  . Healthy Sister   . Healthy Son   . Healthy Daughter     Social History   Social History  . Marital Status: Married    Spouse Name: N/A  . Number of Children: 3  . Years of Education: N/A   Social History Main Topics  . Smoking status: Never Smoker   . Smokeless tobacco: Never Used  . Alcohol Use: Yes     Comment: Rare  . Drug Use: No  . Sexual Activity: Yes    Birth Control/ Protection: Surgical   Other Topics Concern  . None   Social History Narrative   Work or School: homemaker      Home Situation: lives with husband, they have three children and 6 grand children      Spiritual Beliefs: none      Lifestyle: no regular exercise; diet poor                 Current outpatient prescriptions:  .  ALPRAZolam (XANAX) 0.25 MG tablet, Take 0.25 mg by mouth at bedtime as needed for anxiety or sleep., Disp: , Rfl:  .  amphetamine-dextroamphetamine (ADDERALL XR) 30 MG 24 hr capsule, Take 30 mg by mouth daily., Disp: , Rfl:  .  amphetamine-dextroamphetamine (ADDERALL) 20 MG tablet, Take 10-20 mg by mouth 3 (three) times daily. , Disp: , Rfl:  .  BIOTIN PO, Take by mouth., Disp: , Rfl:  .  Cholecalciferol  (VITAMIN D PO), Take 1 tablet by mouth daily., Disp: , Rfl:  .  Cyanocobalamin (VITAMIN B 12 PO), Take 1 tablet by mouth daily., Disp: , Rfl:  .  ibuprofen (ADVIL,MOTRIN) 800 MG tablet, Take 1 tablet (800 mg total) by mouth 2 (two) times daily as needed., Disp: 14 tablet, Rfl: 0 .  nortriptyline (PAMELOR) 10 MG capsule, Take 1 capsule (10 mg total) by mouth at bedtime., Disp: 30 capsule, Rfl: 1 .  thyroid (ARMOUR THYROID) 120 MG tablet, Take 1 tablet (120 mg total) by mouth daily before breakfast., Disp: 30 tablet, Rfl: 2 .  tizanidine (ZANAFLEX) 2 MG capsule, Take 1 capsule (2 mg total) by mouth 2 (two) times daily as needed for muscle spasms., Disp: 20 capsule, Rfl: 0 .  doxycycline (VIBRA-TABS) 100 MG tablet, Take 1 tablet (100 mg total) by mouth 2 (two) times daily., Disp: 20 tablet, Rfl: 0  EXAM:  Filed Vitals:   04/16/15 1102  BP: 120/80  Pulse: 102  Temp: 98.6 F (37 C)    Body mass index is 33.18 kg/(m^2).  GENERAL: vitals reviewed and listed above, alert, oriented, appears well hydrated and in no acute distress  HEENT: atraumatic, conjunttiva clear, no  obvious abnormalities on inspection of external nose and ears, normal appearance of ear canals and TMs with clear effusion R, thick nasal congestion, mild post oropharyngeal erythema with PND, no tonsillar edema or exudate, no sinus TTP  NECK: no obvious masses on inspection  LUNGS: clear to auscultation bilaterally, no wheezes, rales or rhonchi, good air movement  CV: HRRR, no peripheral edema  MS: moves all extremities without noticeable abnormality  PSYCH: pleasant and cooperative, no obvious depression or anxiety  ASSESSMENT AND PLAN:  Discussed the following assessment and plan:  Acute maxillary sinusitis, recurrence not specified -likely given prolonged symptom and worsening, no severe symptoms, opted for abx, risks and return precautions discussed.  Low back pain, unspecified back pain laterality, with sciatica  presence unspecified -resolved  -Patient advised to return or notify a doctor immediately if symptoms worsen or persist or new concerns arise.  Patient Instructions  Please start the antibiotic for the sinus infection.  INSTRUCTIONS FOR UPPER RESPIRATORY INFECTION:  -plenty of rest and fluids  -nasal saline wash 2-3 times daily (use prepackaged nasal saline or bottled/distilled water if making your own)   -can use AFRIN nasal spray for drainage and nasal congestion - but do NOT use longer then 3-4 days  -can use tylenol (in no history of liver disease) or ibuprofen (if no history of kidney disease, bowel bleeding or significant heart disease) as directed for aches and sorethroat  -in the winter time, using a humidifier at night is helpful (please follow cleaning instructions)  -if you are taking a cough medication - use only as directed, may also try a teaspoon of honey to coat the throat and throat lozenges. If given a cough medication with codeine or hydrocodone or other narcotic please be advised that this contains a strong and  potentially addicting medication. Please follow instructions carefully, take as little as possible and only use AS NEEDED for severe cough. Discuss potential side effects with your pharmacy. Please do not drive or operate machinery while taking these types of medications. Please do not take other sedating medications, drugs or alcohol while taking this medication without discussing with your doctor.  -for sore throat, salt water gargles can help  -follow up if you have fevers, facial pain, tooth pain, difficulty breathing or are worsening or symptoms persist longer then expected       Joakim Huesman R.

## 2015-05-05 ENCOUNTER — Telehealth: Payer: Self-pay | Admitting: Family Medicine

## 2015-05-05 NOTE — Telephone Encounter (Signed)
Pt still has a chronic cough. Cough is dry and nagging but not wet or productive. She is keeping her family up at night with it. Had a full blown asthma attack the other night as well. Is there another medication she should try to help kick this cough.

## 2015-05-06 ENCOUNTER — Encounter: Payer: Self-pay | Admitting: Family Medicine

## 2015-05-06 ENCOUNTER — Ambulatory Visit
Admission: RE | Admit: 2015-05-06 | Discharge: 2015-05-06 | Disposition: A | Discharge status: Home / Self Care | Payer: 59 | Source: Ambulatory Visit | Attending: Ophthalmology | Admitting: Ophthalmology

## 2015-05-06 ENCOUNTER — Ambulatory Visit (INDEPENDENT_AMBULATORY_CARE_PROVIDER_SITE_OTHER): Payer: 59 | Admitting: Family Medicine

## 2015-05-06 ENCOUNTER — Ambulatory Visit: Payer: 59 | Admitting: Adult Health

## 2015-05-06 VITALS — BP 116/80 | HR 90 | Ht 61.0 in | Wt 178.5 lb

## 2015-05-06 DIAGNOSIS — H547 Unspecified visual loss: Secondary | ICD-10-CM

## 2015-05-06 DIAGNOSIS — J209 Acute bronchitis, unspecified: Secondary | ICD-10-CM | POA: Diagnosis not present

## 2015-05-06 DIAGNOSIS — J45901 Unspecified asthma with (acute) exacerbation: Secondary | ICD-10-CM

## 2015-05-06 MED ORDER — ALBUTEROL SULFATE HFA 108 (90 BASE) MCG/ACT IN AERS: 2.0000 | INHALATION_SPRAY | Freq: Four times a day (QID) | RESPIRATORY_TRACT | Status: AC | PRN

## 2015-05-06 MED ORDER — PREDNISONE 10 MG PO TABS: ORAL_TABLET | ORAL | Status: DC

## 2015-05-06 MED ORDER — GADOBENATE DIMEGLUMINE 529 MG/ML IV SOLN: 17.0000 mL | Freq: Once | INTRAVENOUS | Status: DC | PRN

## 2015-05-06 NOTE — Progress Notes (Signed)
HPI:  -started: 3-4 days ago - nasal congestion (clear) sneezing, cough, wheeze and asthma attack 2 nights ago that resolved with vicks and humidifier -denies:fever, sinus pain, chills, malaise, SOB today, NVD, tooth pain -has tried: vicks, humidifier -sick contacts/travel/risks: denies flu exposure, tick exposure or or Ebola risks, grandkids with a cold -Hx of: allergies, asthma as a child, sinus infection 3 weeks ago that resolved completely with antibiotic ROS: See pertinent positives and negatives per HPI.  Past Medical History  Diagnosis Date  . ASCUS (atypical squamous cells of undetermined significance) on Pap smear 04/2010    NEG HPV  . GERD (gastroesophageal reflux disease)   . Hypothyroid   . Asthma     as achild, bronchitis 2016    Past Surgical History  Procedure Laterality Date  . Tubal ligation    . Augmentation mammaplasty      Family History  Problem Relation Age of Onset  . Other Father     Unknown  . Healthy Sister   . Healthy Son   . Healthy Daughter     Social History   Social History  . Marital Status: Married    Spouse Name: N/A  . Number of Children: 3  . Years of Education: N/A   Social History Main Topics  . Smoking status: Never Smoker   . Smokeless tobacco: Never Used  . Alcohol Use: Yes     Comment: Rare  . Drug Use: No  . Sexual Activity: Yes    Birth Control/ Protection: Surgical   Other Topics Concern  . None   Social History Narrative   Work or School: homemaker      Home Situation: lives with husband, they have three children and 6 grand children      Spiritual Beliefs: none      Lifestyle: no regular exercise; diet poor                 Current outpatient prescriptions:  .  ALPRAZolam (XANAX) 0.25 MG tablet, Take 0.25 mg by mouth at bedtime as needed for anxiety or sleep., Disp: , Rfl:  .  amphetamine-dextroamphetamine (ADDERALL XR) 30 MG 24 hr capsule, Take 30 mg by mouth daily., Disp: , Rfl:  .   amphetamine-dextroamphetamine (ADDERALL) 20 MG tablet, Take 10-20 mg by mouth 3 (three) times daily. , Disp: , Rfl:  .  BIOTIN PO, Take by mouth., Disp: , Rfl:  .  Cholecalciferol (VITAMIN D PO), Take 1 tablet by mouth daily., Disp: , Rfl:  .  Cyanocobalamin (VITAMIN B 12 PO), Take 1 tablet by mouth daily., Disp: , Rfl:  .  doxycycline (VIBRA-TABS) 100 MG tablet, Take 1 tablet (100 mg total) by mouth 2 (two) times daily., Disp: 20 tablet, Rfl: 0 .  ibuprofen (ADVIL,MOTRIN) 800 MG tablet, Take 1 tablet (800 mg total) by mouth 2 (two) times daily as needed., Disp: 14 tablet, Rfl: 0 .  nortriptyline (PAMELOR) 10 MG capsule, Take 1 capsule (10 mg total) by mouth at bedtime., Disp: 30 capsule, Rfl: 1 .  thyroid (ARMOUR THYROID) 120 MG tablet, Take 1 tablet (120 mg total) by mouth daily before breakfast., Disp: 30 tablet, Rfl: 2 .  tizanidine (ZANAFLEX) 2 MG capsule, Take 1 capsule (2 mg total) by mouth 2 (two) times daily as needed for muscle spasms., Disp: 20 capsule, Rfl: 0 .  albuterol (PROVENTIL HFA;VENTOLIN HFA) 108 (90 BASE) MCG/ACT inhaler, Inhale 2 puffs into the lungs every 6 (six) hours as needed., Disp: 1 Inhaler,  Rfl: 0 .  predniSONE (DELTASONE) 10 MG tablet, 40mg  (4tabs) for 2 days, then 30mg (3 tabs) x 2 days, then 20mg  (2 tabs) x 2 days, then 10mg  (1 tab) x2 days, Disp: 20 tablet, Rfl: 0  EXAM:  Filed Vitals:   05/06/15 1355  BP: 116/80  Pulse: 90  O2 sat 80% on RA Staff attempted to check temp but she was drinking icy soda - she reported she had no fevers. Staff was to recheck on leaving but pt left before recheck.  Body mass index is 33.74 kg/(m^2).  GENERAL: vitals reviewed and listed above, alert, oriented, appears well hydrated and in no acute distress  HEENT: atraumatic, conjunttiva clear, no obvious abnormalities on inspection of external nose and ears, normal appearance of ear canals and TMs, clear nasal congestion, mild post oropharyngeal erythema with PND, no tonsillar  edema or exudate, no sinus TTP  NECK: no obvious masses on inspection  LUNGS: clear to auscultation bilaterally, no wheezes, rales or rhonchi, good air movement, O2 sats normal on RA  CV: HRRR, no peripheral edema  MS: moves all extremities without noticeable abnormality  PSYCH: pleasant and cooperative, no obvious depression or anxiety  ASSESSMENT AND PLAN:  Discussed the following assessment and plan:  Acute bronchitis, unspecified organism  Asthma with acute exacerbation, unspecified asthma severity  -given HPI and exam findings today, a serious infection or illness is unlikely. We discussed potential etiologies, with VURI with bronchitis being most likely. Given her hx of asthma opted for short prednisone taper, alb prn and supportive care. We discussed treatment side effects, likely course, antibiotic misuse, transmission, and signs of developing a worsening or severe illness. -of course, we advised to return or notify a doctor immediately if symptoms worsen or persist or new concerns arise.    Patient Instructions  Use the prednisone as instructed  Albuterol if needed  INSTRUCTIONS FOR UPPER RESPIRATORY INFECTION:  -plenty of rest and fluids  -nasal saline wash 2-3 times daily (use prepackaged nasal saline or bottled/distilled water if making your own)   -can use AFRIN nasal spray for drainage and nasal congestion - but do NOT use longer then 3-4 days  -can use tylenol (in no history of liver disease) or ibuprofen (if no history of kidney disease, bowel bleeding or significant heart disease) as directed for aches and sorethroat  -in the winter time, using a humidifier at night is helpful (please follow cleaning instructions)  -if you are taking a cough medication - use only as directed, may also try a teaspoon of honey to coat the throat and throat lozenges. If given a cough medication with codeine or hydrocodone or other narcotic please be advised that this contains a  strong and  potentially addicting medication. Please follow instructions carefully, take as little as possible and only use AS NEEDED for severe cough. Discuss potential side effects with your pharmacy. Please do not drive or operate machinery while taking these types of medications. Please do not take other sedating medications, drugs or alcohol while taking this medication without discussing with your doctor.  -for sore throat, salt water gargles can help  -follow up if you have fevers, facial pain, tooth pain, difficulty breathing or are worsening or symptoms persist longer then expected  Upper Respiratory Infection, Adult An upper respiratory infection (URI) is also known as the common cold. It is often caused by a type of germ (virus). Colds are easily spread (contagious). You can pass it to others by kissing, coughing, sneezing, or  drinking out of the same glass. Usually, you get better in 1 to 3  weeks.  However, the cough can last for even longer. HOME CARE   Only take medicine as told by your doctor. Follow instructions provided above.  Drink enough water and fluids to keep your pee (urine) clear or pale yellow.  Get plenty of rest.  Return to work when your temperature is < 100 for 24 hours or as told by your doctor. You may use a face mask and wash your hands to stop your cold from spreading. GET HELP RIGHT AWAY IF:   After the first few days, you feel you are getting worse.  You have questions about your medicine.  You have chills, shortness of breath, or red spit (mucus).  You have pain in the face for more then 1-2 days, especially when you bend forward.  You have a fever, puffy (swollen) neck, pain when you swallow, or white spots in the back of your throat.  You have a bad headache, ear pain, sinus pain, or chest pain.  You have a high-pitched whistling sound when you breathe in and out (wheezing).  You cough up blood.  You have sore muscles or a stiff neck. MAKE  SURE YOU:   Understand these instructions.  Will watch your condition.  Will get help right away if you are not doing well or get worse. Document Released: 11/08/2007 Document Revised: 08/14/2011 Document Reviewed: 08/27/2013 [facility] Patient Information 2015 [place], [place]. This information is not intended to replace advice given to you by your health care provider. Make sure you discuss any questions you have with your health care provider.      Kriste Basque R.

## 2015-05-06 NOTE — Telephone Encounter (Signed)
Pt now scheduled with Dr. Selena BattenKim for today at 3:30 pm.

## 2015-05-06 NOTE — Patient Instructions (Signed)
Use the prednisone as instructed  Albuterol if needed  INSTRUCTIONS FOR UPPER RESPIRATORY INFECTION:  -plenty of rest and fluids  -nasal saline wash 2-3 times daily (use prepackaged nasal saline or bottled/distilled water if making your own)   -can use AFRIN nasal spray for drainage and nasal congestion - but do NOT use longer then 3-4 days  -can use tylenol (in no history of liver disease) or ibuprofen (if no history of kidney disease, bowel bleeding or significant heart disease) as directed for aches and sorethroat  -in the winter time, using a humidifier at night is helpful (please follow cleaning instructions)  -if you are taking a cough medication - use only as directed, may also try a teaspoon of honey to coat the throat and throat lozenges. If given a cough medication with codeine or hydrocodone or other narcotic please be advised that this contains a strong and  potentially addicting medication. Please follow instructions carefully, take as little as possible and only use AS NEEDED for severe cough. Discuss potential side effects with your pharmacy. Please do not drive or operate machinery while taking these types of medications. Please do not take other sedating medications, drugs or alcohol while taking this medication without discussing with your doctor.  -for sore throat, salt water gargles can help  -follow up if you have fevers, facial pain, tooth pain, difficulty breathing or are worsening or symptoms persist longer then expected  Upper Respiratory Infection, Adult An upper respiratory infection (URI) is also known as the common cold. It is often caused by a type of germ (virus). Colds are easily spread (contagious). You can pass it to others by kissing, coughing, sneezing, or drinking out of the same glass. Usually, you get better in 1 to 3  weeks.  However, the cough can last for even longer. HOME CARE   Only take medicine as told by your doctor. Follow instructions  provided above.  Drink enough water and fluids to keep your pee (urine) clear or pale yellow.  Get plenty of rest.  Return to work when your temperature is < 100 for 24 hours or as told by your doctor. You may use a face mask and wash your hands to stop your cold from spreading. GET HELP RIGHT AWAY IF:   After the first few days, you feel you are getting worse.  You have questions about your medicine.  You have chills, shortness of breath, or red spit (mucus).  You have pain in the face for more then 1-2 days, especially when you bend forward.  You have a fever, puffy (swollen) neck, pain when you swallow, or white spots in the back of your throat.  You have a bad headache, ear pain, sinus pain, or chest pain.  You have a high-pitched whistling sound when you breathe in and out (wheezing).  You cough up blood.  You have sore muscles or a stiff neck. MAKE SURE YOU:   Understand these instructions.  Will watch your condition.  Will get help right away if you are not doing well or get worse. Document Released: 11/08/2007 Document Revised: 08/14/2011 Document Reviewed: 08/27/2013 [facility] Patient Information 2015 [place]. This information is not intended to replace advice given to you by your health care provider. Make sure you discuss any questions you have with your health care provider.

## 2015-05-06 NOTE — Telephone Encounter (Signed)
I called the pt and informed her of the message below and offered an appt today for the opening at 3:45pm with Dr Selena BattenKim.  She stated she has an MRI at 4pm today and I scheduled an appt with Kandee Keenory at 2:45pm.

## 2015-05-06 NOTE — Telephone Encounter (Signed)
Can we work her in today? Thanks!

## 2015-05-06 NOTE — Progress Notes (Signed)
Pre visit review using our clinic review tool, if applicable. No additional management support is needed unless otherwise documented below in the visit note. 

## 2015-05-17 ENCOUNTER — Telehealth: Payer: Self-pay | Admitting: Family Medicine

## 2015-05-17 NOTE — Telephone Encounter (Signed)
Appt scheduled tomorrow(05/18/15) with Dr. Selena BattenKim at 11:15am.

## 2015-05-17 NOTE — Telephone Encounter (Signed)
[facility] - Client TELEPHONE ADVICE RECORD [facility]  Patient Name: Joy BouchardPRIL Wilson  DOB: Oct 31, 1965    Initial Comment Caller states was given medication for the cold; not feeling better; coughing; vomiting;    Nurse Assessment  Nurse: Dorthula RuePatten, RN, Enrique SackKendra Date/Time (Eastern Time): 05/17/2015 2:41:57 PM  Confirm and document reason for call. If symptomatic, describe symptoms. ---Caller states she saw the doctor 10 days ago for a cold. She thinks she has a fever, but has not taken it, and it was out 100 last night. Caller is c/o chest congestion.  Has the patient traveled out of the country within the last 30 days? ---No  Does the patient have any new or worsening symptoms? ---Yes  Will a triage be completed? ---Yes  Related visit to physician within the last 2 weeks? ---Yes  Does the PT have any chronic conditions? (i.e. diabetes, asthma, etc.) ---No  Did the patient indicate they were pregnant? ---No  Is this a behavioral health or substance abuse call? ---No     Guidelines    Guideline Title Affirmed Question Affirmed Notes  Cough - Acute Non-Productive SEVERE coughing spells (e.g., whooping sound after coughing, vomiting after coughing)    Final Disposition User   See Physician within 24 Hours Dorthula RuePatten, RN, [employer] EnergyKendra    Referrals  REFERRED TO PCP OFFICE   Disagree/Comply: Danella Maiersomply

## 2015-05-17 NOTE — Telephone Encounter (Signed)
error 

## 2015-05-18 ENCOUNTER — Encounter: Payer: Self-pay | Admitting: Family Medicine

## 2015-05-18 ENCOUNTER — Ambulatory Visit (INDEPENDENT_AMBULATORY_CARE_PROVIDER_SITE_OTHER): Payer: 59 | Admitting: Family Medicine

## 2015-05-18 VITALS — BP 100/80 | HR 96 | Temp 97.5°F | Ht 61.0 in | Wt 176.6 lb

## 2015-05-18 DIAGNOSIS — B349 Viral infection, unspecified: Secondary | ICD-10-CM

## 2015-05-18 DIAGNOSIS — J4531 Mild persistent asthma with (acute) exacerbation: Secondary | ICD-10-CM

## 2015-05-18 MED ORDER — BENZONATATE 100 MG PO CAPS: 100.0000 mg | ORAL_CAPSULE | Freq: Three times a day (TID) | ORAL | Status: AC | PRN

## 2015-05-18 MED ORDER — BECLOMETHASONE DIPROPIONATE 40 MCG/ACT IN AERS: 1.0000 | INHALATION_SPRAY | Freq: Two times a day (BID) | RESPIRATORY_TRACT | Status: AC

## 2015-05-18 NOTE — Progress Notes (Signed)
Pre visit review using our clinic review tool, if applicable. No additional management support is needed unless otherwise documented below in the visit note. 

## 2015-05-18 NOTE — Progress Notes (Signed)
HPI:  -started: 4 days ago; had just gotten over prior resp illness and had been feeling great -symptoms: started with low grade fever, nausea, vomiting, diarrhea, sore throat, nasal congestion, cough - now most symptoms resolved bu cough remains with PND and occ feels wheezy -denies:fever, SOB, persistent NVD, tooth pain -has tried: nothing, has not used her albuterol -sick contacts/travel/risks: denies flu exposure, tick exposure or or Ebola risks -Hx of: asthma as a child and seems to get bronchitis with resp illnesses ROS: See pertinent positives and negatives per HPI.  Past Medical History  Diagnosis Date  . ASCUS (atypical squamous cells of undetermined significance) on Pap smear 04/2010    NEG HPV  . GERD (gastroesophageal reflux disease)   . Hypothyroid   . Asthma     as achild, bronchitis 2016    Past Surgical History  Procedure Laterality Date  . Tubal ligation    . Augmentation mammaplasty      Family History  Problem Relation Age of Onset  . Other Father     Unknown  . Healthy Sister   . Healthy Son   . Healthy Daughter     Social History   Social History  . Marital Status: Married    Spouse Name: N/A  . Number of Children: 3  . Years of Education: N/A   Social History Main Topics  . Smoking status: Never Smoker   . Smokeless tobacco: Never Used  . Alcohol Use: Yes     Comment: Rare  . Drug Use: No  . Sexual Activity: Yes    Birth Control/ Protection: Surgical   Other Topics Concern  . None   Social History Narrative   Work or School: homemaker      Home Situation: lives with husband, they have three children and 6 grand children      Spiritual Beliefs: none      Lifestyle: no regular exercise; diet poor                 Current outpatient prescriptions:  .  albuterol (PROVENTIL HFA;VENTOLIN HFA) 108 (90 BASE) MCG/ACT inhaler, Inhale 2 puffs into the lungs every 6 (six) hours as needed., Disp: 1 Inhaler, Rfl: 0 .  ALPRAZolam  (XANAX) 0.25 MG tablet, Take 0.25 mg by mouth at bedtime as needed for anxiety or sleep., Disp: , Rfl:  .  amphetamine-dextroamphetamine (ADDERALL XR) 30 MG 24 hr capsule, Take 30 mg by mouth daily., Disp: , Rfl:  .  amphetamine-dextroamphetamine (ADDERALL) 20 MG tablet, Take 10-20 mg by mouth 3 (three) times daily. , Disp: , Rfl:  .  BIOTIN PO, Take by mouth., Disp: , Rfl:  .  Cholecalciferol (VITAMIN D PO), Take 1 tablet by mouth daily., Disp: , Rfl:  .  Cyanocobalamin (VITAMIN B 12 PO), Take 1 tablet by mouth daily., Disp: , Rfl:  .  ibuprofen (ADVIL,MOTRIN) 800 MG tablet, Take 1 tablet (800 mg total) by mouth 2 (two) times daily as needed., Disp: 14 tablet, Rfl: 0 .  nortriptyline (PAMELOR) 10 MG capsule, Take 1 capsule (10 mg total) by mouth at bedtime., Disp: 30 capsule, Rfl: 1 .  thyroid (ARMOUR THYROID) 120 MG tablet, Take 1 tablet (120 mg total) by mouth daily before breakfast., Disp: 30 tablet, Rfl: 2 .  tizanidine (ZANAFLEX) 2 MG capsule, Take 1 capsule (2 mg total) by mouth 2 (two) times daily as needed for muscle spasms., Disp: 20 capsule, Rfl: 0 .  beclomethasone (QVAR) 40 MCG/ACT inhaler, Inhale  1 puff into the lungs 2 (two) times daily., Disp: 1 Inhaler, Rfl: 3 .  benzonatate (TESSALON PERLES) 100 MG capsule, Take 1 capsule (100 mg total) by mouth 3 (three) times daily as needed for cough., Disp: 20 capsule, Rfl: 0  EXAM:  Filed Vitals:   05/18/15 1047  BP: 100/80  Pulse: 96  Temp: 97.5 F (36.4 C)    Body mass index is 33.39 kg/(m^2).  GENERAL: vitals reviewed and listed above, alert, oriented, appears well hydrated and in no acute distress  HEENT: atraumatic, conjunttiva clear, no obvious abnormalities on inspection of external nose and ears, normal appearance of ear canals and TMs, clear nasal congestion, mild post oropharyngeal erythema with PND, no tonsillar edema or exudate, no sinus TTP  NECK: no obvious masses on inspection  LUNGS: clear to auscultation  bilaterally, no wheezes, rales or rhonchi, good air movement  CV: HRRR, no peripheral edema  MS: moves all extremities without noticeable abnormality  PSYCH: pleasant and cooperative, no obvious depression or anxiety  ASSESSMENT AND PLAN:  Discussed the following assessment and plan:  Viral illness - Plan: benzonatate (TESSALON PERLES) 100 MG capsule  Asthma with acute exacerbation, mild persistent - Plan: beclomethasone (QVAR) 40 MCG/ACT inhaler  -given HPI and exam findings today, a serious infection or illness is unlikely. We discussed potential etiologies, with VURI being most likely, and advised supportive care and monitoring. We discussed treatment side effects, likely course, antibiotic misuse, transmission, and signs of developing a serious illness. -her acute onset and symptoms, now resolving do sound like another viral illness, with I believe underlying mild asthma with propensity for bronchial involvement with VURI -cxr to exclude PNA or other -start qvar, alb prn, follow up if worsening and in 1 month -PFT when feeling better -of course, we advised to return or notify a doctor immediately if symptoms worsen or persist or new concerns arise.    Patient Instructions  Follow up in 1 month - or sooner if worsening or not improving Xray sheet  Go get chest xray  Start the qvar 1 puff twice daily for one month until seen  Use the albuterol as needed per instructions for wheezing, tightness or excessive coughing  INSTRUCTIONS FOR UPPER RESPIRATORY INFECTION:  -plenty of rest and fluids  -nasal saline wash 2-3 times daily (use prepackaged nasal saline or bottled/distilled water if making your own)   -can use AFRIN nasal spray for drainage and nasal congestion - but do NOT use longer then 3-4 days  -can use tylenol (in no history of liver disease) or ibuprofen (if no history of kidney disease, bowel bleeding or significant heart disease) as directed for aches and  sorethroat  -in the winter time, using a humidifier at night is helpful (please follow cleaning instructions)  -if you are taking a cough medication - use only as directed, may also try a teaspoon of honey to coat the throat and throat lozenges. If given a cough medication with codeine or hydrocodone or other narcotic please be advised that this contains a strong and  potentially addicting medication. Please follow instructions carefully, take as little as possible and only use AS NEEDED for severe cough. Discuss potential side effects with your pharmacy. Please do not drive or operate machinery while taking these types of medications. Please do not take other sedating medications, drugs or alcohol while taking this medication without discussing with your doctor.  -for sore throat, salt water gargles can help  -follow up if you have fevers,  facial pain, tooth pain, difficulty breathing or are worsening or symptoms persist longer then expected  Upper Respiratory Infection, Adult An upper respiratory infection (URI) is also known as the common cold. It is often caused by a type of germ (virus). Colds are easily spread (contagious). You can pass it to others by kissing, coughing, sneezing, or drinking out of the same glass. Usually, you get better in 1 to 3  weeks.  However, the cough can last for even longer. HOME CARE   Only take medicine as told by your doctor. Follow instructions provided above.  Drink enough water and fluids to keep your pee (urine) clear or pale yellow.  Get plenty of rest.  Return to work when your temperature is < 100 for 24 hours or as told by your doctor. You may use a face mask and wash your hands to stop your cold from spreading. GET HELP RIGHT AWAY IF:   After the first few days, you feel you are getting worse.  You have questions about your medicine.  You have chills, shortness of breath, or red spit (mucus).  You have pain in the face for more then 1-2 days,  especially when you bend forward.  You have a fever, puffy (swollen) neck, pain when you swallow, or white spots in the back of your throat.  You have a bad headache, ear pain, sinus pain, or chest pain.  You have a high-pitched whistling sound when you breathe in and out (wheezing).  You cough up blood.  You have sore muscles or a stiff neck. MAKE SURE YOU:   Understand these instructions.  Will watch your condition.  Will get help right away if you are not doing well or get worse. Document Released: 11/08/2007 Document Revised: 08/14/2011 Document Reviewed: 08/27/2013 [facility]ExitCare Patient Information 2015 [place], [place]. This information is not intended to replace advice given to you by your health care provider. Make sure you discuss any questions you have with your health care provider.      Kriste BasqueKIM, Vernette Moise R.

## 2015-05-18 NOTE — Patient Instructions (Addendum)
Follow up in 1 month - or sooner if worsening or not improving Xray sheet  Go get chest xray  Start the qvar 1 puff twice daily for one month until seen  Use the albuterol as needed per instructions for wheezing, tightness or excessive coughing  INSTRUCTIONS FOR UPPER RESPIRATORY INFECTION:  -plenty of rest and fluids  -nasal saline wash 2-3 times daily (use prepackaged nasal saline or bottled/distilled water if making your own)   -can use AFRIN nasal spray for drainage and nasal congestion - but do NOT use longer then 3-4 days  -can use tylenol (in no history of liver disease) or ibuprofen (if no history of kidney disease, bowel bleeding or significant heart disease) as directed for aches and sorethroat  -in the winter time, using a humidifier at night is helpful (please follow cleaning instructions)  -if you are taking a cough medication - use only as directed, may also try a teaspoon of honey to coat the throat and throat lozenges. If given a cough medication with codeine or hydrocodone or other narcotic please be advised that this contains a strong and  potentially addicting medication. Please follow instructions carefully, take as little as possible and only use AS NEEDED for severe cough. Discuss potential side effects with your pharmacy. Please do not drive or operate machinery while taking these types of medications. Please do not take other sedating medications, drugs or alcohol while taking this medication without discussing with your doctor.  -for sore throat, salt water gargles can help  -follow up if you have fevers, facial pain, tooth pain, difficulty breathing or are worsening or symptoms persist longer then expected  Upper Respiratory Infection, Adult An upper respiratory infection (URI) is also known as the common cold. It is often caused by a type of germ (virus). Colds are easily spread (contagious). You can pass it to others by kissing, coughing, sneezing, or drinking  out of the same glass. Usually, you get better in 1 to 3  weeks.  However, the cough can last for even longer. HOME CARE   Only take medicine as told by your doctor. Follow instructions provided above.  Drink enough water and fluids to keep your pee (urine) clear or pale yellow.  Get plenty of rest.  Return to work when your temperature is < 100 for 24 hours or as told by your doctor. You may use a face mask and wash your hands to stop your cold from spreading. GET HELP RIGHT AWAY IF:   After the first few days, you feel you are getting worse.  You have questions about your medicine.  You have chills, shortness of breath, or red spit (mucus).  You have pain in the face for more then 1-2 days, especially when you bend forward.  You have a fever, puffy (swollen) neck, pain when you swallow, or white spots in the back of your throat.  You have a bad headache, ear pain, sinus pain, or chest pain.  You have a high-pitched whistling sound when you breathe in and out (wheezing).  You cough up blood.  You have sore muscles or a stiff neck. MAKE SURE YOU:   Understand these instructions.  Will watch your condition.  Will get help right away if you are not doing well or get worse. Document Released: 11/08/2007 Document Revised: 08/14/2011 Document Reviewed: 08/27/2013 [facility] Patient Information 2015 [place], [place]. This information is not intended to replace advice given to you by your health care provider. Make sure you  discuss any questions you have with your health care provider.  

## 2015-05-25 ENCOUNTER — Emergency Department (HOSPITAL_COMMUNITY): Payer: 59

## 2015-05-25 ENCOUNTER — Encounter (HOSPITAL_COMMUNITY): Payer: Self-pay

## 2015-05-25 ENCOUNTER — Emergency Department (HOSPITAL_COMMUNITY)
Admission: EM | Admit: 2015-05-25 | Discharge: 2015-05-25 | Disposition: A | Discharge status: Home / Self Care | Payer: 59 | Attending: Emergency Medicine | Admitting: Emergency Medicine

## 2015-05-25 DIAGNOSIS — J45901 Unspecified asthma with (acute) exacerbation: Secondary | ICD-10-CM | POA: Diagnosis not present

## 2015-05-25 DIAGNOSIS — Z79899 Other long term (current) drug therapy: Secondary | ICD-10-CM | POA: Diagnosis not present

## 2015-05-25 DIAGNOSIS — E039 Hypothyroidism, unspecified: Secondary | ICD-10-CM | POA: Insufficient documentation

## 2015-05-25 DIAGNOSIS — R0602 Shortness of breath: Secondary | ICD-10-CM

## 2015-05-25 DIAGNOSIS — Z8719 Personal history of other diseases of the digestive system: Secondary | ICD-10-CM | POA: Diagnosis not present

## 2015-05-25 DIAGNOSIS — Z7952 Long term (current) use of systemic steroids: Secondary | ICD-10-CM | POA: Diagnosis not present

## 2015-05-25 MED ORDER — IPRATROPIUM-ALBUTEROL 0.5-2.5 (3) MG/3ML IN SOLN
RESPIRATORY_TRACT | Status: DC
Start: 2015-05-25 — End: 2015-05-25
  Filled 2015-05-25: qty 3

## 2015-05-25 MED ORDER — IPRATROPIUM-ALBUTEROL 0.5-2.5 (3) MG/3ML IN SOLN: 3.0000 mL | Freq: Once | RESPIRATORY_TRACT | Status: DC

## 2015-05-25 MED ORDER — IPRATROPIUM-ALBUTEROL 0.5-2.5 (3) MG/3ML IN SOLN
RESPIRATORY_TRACT | Status: AC
  Filled 2015-05-25: qty 3

## 2015-05-25 MED ORDER — IBUPROFEN 800 MG PO TABS
800.0000 mg | ORAL_TABLET | Freq: Once | ORAL | Status: AC
  Administered 2015-05-25: 800 mg via ORAL
  Filled 2015-05-25: qty 1

## 2015-05-25 MED ORDER — ALBUTEROL SULFATE (2.5 MG/3ML) 0.083% IN NEBU
25.0000 mg | INHALATION_SOLUTION | Freq: Once | RESPIRATORY_TRACT | Status: AC
  Administered 2015-05-25: 5 mg via RESPIRATORY_TRACT

## 2015-05-25 MED ORDER — PREDNISONE 20 MG PO TABS
60.0000 mg | ORAL_TABLET | Freq: Once | ORAL | Status: AC
  Administered 2015-05-25: 60 mg via ORAL
  Filled 2015-05-25: qty 3

## 2015-05-25 MED ORDER — PREDNISONE 20 MG PO TABS: 60.0000 mg | ORAL_TABLET | Freq: Every day | ORAL | Status: AC

## 2015-05-25 NOTE — Discharge Instructions (Signed)
Ms. Joy Wilson,  [place] you! Please follow-up with your primary care provider. Return to the emergency department if you develop headaches, shortness of breath, or generalized weakness, nausea or vomiting. Although you did not have carbon monoxide exposure, I've attached the following information that can relate to propane gas. Feel better soon!  S. Lane HackerNicole Sorcha Rotunno, PA-C

## 2015-05-25 NOTE — ED Notes (Signed)
Patient here with increasing shortness of breath, reports that she thinks related to her asthma. Using inhaler with minimal relief

## 2015-05-25 NOTE — ED Provider Notes (Signed)
CSN: [id number]     Arrival date & time 05/25/15  1229 History   First MD Initiated Contact with Patient 05/25/15 1430     Chief Complaint  Patient presents with  . Shortness of Breath  . Asthma   HPI Joy Wilson is a 49 y.o. F PMH significant for asthma presenting with a 2 month history of shortness of breath. She has been seen by her PCP for similar symptoms recently and was given an inhaler, which has provided minimal relief for her. She endorses chest pain and describes it as 10/10 pain scale, intermittent, only associated with coughing, similar to chest pain she has had in the past with asthma. No fevers, chills, abdominal pain, N/V/D.   Past Medical History  Diagnosis Date  . ASCUS (atypical squamous cells of undetermined significance) on Pap smear 04/2010    NEG HPV  . GERD (gastroesophageal reflux disease)   . Hypothyroid   . Asthma     as achild, bronchitis 2016   Past Surgical History  Procedure Laterality Date  . Tubal ligation    . Augmentation mammaplasty     Family History  Problem Relation Age of Onset  . Other Father     Unknown  . Healthy Sister   . Healthy Son   . Healthy Daughter    Social History  Substance Use Topics  . Smoking status: Never Smoker   . Smokeless tobacco: Never Used  . Alcohol Use: Yes     Comment: Rare   OB History    Gravida Para Term Preterm AB TAB SAB Ectopic Multiple Living   Review of Systems  Ten systems are reviewed and are negative for acute change except as noted in the HPI  Allergies  Review of patient's allergies indicates no known allergies.  Home Medications   Prior to Admission medications   Medication Sig Start Date End Date Taking? Authorizing Provider  albuterol (PROVENTIL HFA;VENTOLIN HFA) 108 (90 BASE) MCG/ACT inhaler Inhale 2 puffs into the lungs every 6 (six) hours as needed. 05/06/15   Terressa Koyanagi, DO  ALPRAZolam Prudy Feeler) 0.25 MG tablet Take 0.25 mg by mouth at bedtime as needed  for anxiety or sleep.    Historical Provider, MD  amphetamine-dextroamphetamine (ADDERALL XR) 30 MG 24 hr capsule Take 30 mg by mouth daily.    Historical Provider, MD  amphetamine-dextroamphetamine (ADDERALL) 20 MG tablet Take 10-20 mg by mouth 3 (three) times daily.     Historical Provider, MD  beclomethasone (QVAR) 40 MCG/ACT inhaler Inhale 1 puff into the lungs 2 (two) times daily. 05/18/15   Terressa Koyanagi, DO  benzonatate (TESSALON PERLES) 100 MG capsule Take 1 capsule (100 mg total) by mouth 3 (three) times daily as needed for cough. 05/18/15   Terressa Koyanagi, DO  BIOTIN PO Take by mouth.    Historical Provider, MD  Cholecalciferol (VITAMIN D PO) Take 1 tablet by mouth daily.    Historical Provider, MD  Cyanocobalamin (VITAMIN B 12 PO) Take 1 tablet by mouth daily.    Historical Provider, MD  ibuprofen (ADVIL,MOTRIN) 800 MG tablet Take 1 tablet (800 mg total) by mouth 2 (two) times daily as needed. 03/09/15   Terressa Koyanagi, DO  nortriptyline (PAMELOR) 10 MG capsule Take 1 capsule (10 mg total) by mouth at bedtime. 01/07/15   Kristian Covey, MD  thyroid (ARMOUR THYROID) 120 MG tablet Take 1 tablet (  120 mg total) by mouth daily before breakfast. 06/29/14   Terressa KoyanagiHannah R Kim, DO  tizanidine (ZANAFLEX) 2 MG capsule Take 1 capsule (2 mg total) by mouth 2 (two) times daily as needed for muscle spasms. 03/09/15   Terressa KoyanagiHannah R Kim, DO   BP 123/84 mmHg  Pulse 88  Temp(Src) 98.6 F (37 C)  Resp 26  SpO2 99%  LMP 05/12/2015 Physical Exam  Constitutional: She appears well-developed and well-nourished. No distress.  HENT:  Head: Normocephalic and atraumatic.  Mouth/Throat: Oropharynx is clear and moist. No oropharyngeal exudate.  Eyes: Conjunctivae are normal. Pupils are equal, round, and reactive to light. Right eye exhibits no discharge. Left eye exhibits no discharge. No scleral icterus.  Neck: No tracheal deviation present.  Cardiovascular: Normal rate, regular rhythm, normal heart sounds and intact distal  pulses.  Exam reveals no gallop and no friction rub.   No murmur heard. Pulmonary/Chest: Effort normal. No respiratory distress. She has wheezes. She has no rales. She exhibits no tenderness.  Minimal expiratory wheezes  Abdominal: Soft. Bowel sounds are normal. She exhibits no distension and no mass. There is no tenderness. There is no rebound and no guarding.  Musculoskeletal: She exhibits no edema.  Lymphadenopathy:    She has no cervical adenopathy.  Neurological: She is alert. Coordination normal.  Skin: Skin is warm and dry. No rash noted. She is not diaphoretic. No erythema.  Psychiatric: She has a normal mood and affect. Her behavior is normal.  Nursing note and vitals reviewed.   ED Course  Procedures  Imaging Review Dg Chest 2 View  05/25/2015  CLINICAL DATA:  Patient with cough and chest pain. Cold like symptoms. Shortness of breath for 3 days. EXAM: CHEST  2 VIEW COMPARISON:  Chest radiograph 06/19/2013. FINDINGS: Stable cardiac and mediastinal contours. No consolidative pulmonary opacities. No pleural effusion or pneumothorax. Mid thoracic spine degenerative changes. IMPRESSION: No active cardiopulmonary disease. Electronically Signed   By: Annia Beltrew  Davis M.D.   On: 05/25/2015 13:32   I have personally reviewed and evaluated these images and lab results as part of my medical decision-making.  MDM   Final diagnoses:  Shortness of breath  Asthma, unspecified asthma severity, with acute exacerbation   Patient non-toxic appearing and VSS. Most likely asthma and URI. Less likely PE, ACS.  CXR negative.  Patient feeling better on reevaluation. Will give steroids, ibuprofen and have patient follow-up with PCP.  Melton KrebsSamantha Nicole Kathlynn Swofford, PA-C 05/31/15 0009  Lavera Guiseana Duo Liu, MD 06/02/15 1420

## 2015-08-05 ENCOUNTER — Ambulatory Visit: Payer: 59 | Admitting: Neurology

## 2015-12-01 ENCOUNTER — Telehealth: Payer: Self-pay | Admitting: Family Medicine

## 2015-12-01 NOTE — Telephone Encounter (Signed)
Pt request refill  thyroid (ARMOUR THYROID) 120 MG tablet  Pt states Dr Selena BattenKim has filled before, but she starting seeing an endocrinologist. Pt does not want to go back to that dr.balan, would like a referral in the meantime to another   endocrinologist.  Pt has been out for a week and would like ASAP. Pt aware dr Selena Battenkim is out this week.  [facility]/ [address]  Pt will come in if need to.

## 2015-12-02 NOTE — Telephone Encounter (Signed)
Needs appointment. She has seen several endocrinologist. We need to check labs and talk.

## 2015-12-03 NOTE — Telephone Encounter (Signed)
I left a detailed message with the information below and asked that the pt call the office for an appt.

## 2015-12-20 ENCOUNTER — Ambulatory Visit (INDEPENDENT_AMBULATORY_CARE_PROVIDER_SITE_OTHER): Payer: BLUE CROSS/BLUE SHIELD | Admitting: Family Medicine

## 2015-12-20 ENCOUNTER — Ambulatory Visit (HOSPITAL_COMMUNITY)
Admission: EM | Admit: 2015-12-20 | Discharge: 2015-12-20 | Disposition: A | Discharge status: Nursing Home (Includes ICF) | Payer: BLUE CROSS/BLUE SHIELD | Attending: Emergency Medicine | Admitting: Emergency Medicine

## 2015-12-20 ENCOUNTER — Emergency Department (HOSPITAL_COMMUNITY)
Admission: EM | Admit: 2015-12-20 | Discharge: 2015-12-20 | Disposition: A | Discharge status: Home / Self Care | Payer: BLUE CROSS/BLUE SHIELD | Attending: Dermatology | Admitting: Dermatology

## 2015-12-20 ENCOUNTER — Encounter (HOSPITAL_COMMUNITY): Payer: Self-pay | Admitting: Emergency Medicine

## 2015-12-20 ENCOUNTER — Encounter (HOSPITAL_COMMUNITY): Payer: Self-pay | Admitting: *Deleted

## 2015-12-20 DIAGNOSIS — R69 Illness, unspecified: Secondary | ICD-10-CM

## 2015-12-20 DIAGNOSIS — Z5321 Procedure and treatment not carried out due to patient leaving prior to being seen by health care provider: Secondary | ICD-10-CM | POA: Diagnosis not present

## 2015-12-20 DIAGNOSIS — R112 Nausea with vomiting, unspecified: Secondary | ICD-10-CM

## 2015-12-20 DIAGNOSIS — Z0289 Encounter for other administrative examinations: Secondary | ICD-10-CM

## 2015-12-20 DIAGNOSIS — T679XXA Effect of heat and light, unspecified, initial encounter: Secondary | ICD-10-CM

## 2015-12-20 DIAGNOSIS — H539 Unspecified visual disturbance: Secondary | ICD-10-CM | POA: Diagnosis not present

## 2015-12-20 DIAGNOSIS — R079 Chest pain, unspecified: Secondary | ICD-10-CM | POA: Insufficient documentation

## 2015-12-20 DIAGNOSIS — R519 Headache, unspecified: Secondary | ICD-10-CM

## 2015-12-20 DIAGNOSIS — R51 Headache: Secondary | ICD-10-CM | POA: Diagnosis not present

## 2015-12-20 DIAGNOSIS — S0003XA Contusion of scalp, initial encounter: Secondary | ICD-10-CM

## 2015-12-20 DIAGNOSIS — H9319 Tinnitus, unspecified ear: Secondary | ICD-10-CM | POA: Diagnosis not present

## 2015-12-20 HISTORY — DX: Attention-deficit hyperactivity disorder, unspecified type: F90.9

## 2015-12-20 LAB — COMPREHENSIVE METABOLIC PANEL
ALK PHOS: 78 U/L (ref 38–126)
ALT: 19 U/L (ref 14–54)
ANION GAP: 8 (ref 5–15)
AST: 24 U/L (ref 15–41)
Albumin: 4.8 g/dL (ref 3.5–5.0)
BUN: 14 mg/dL (ref 6–20)
CALCIUM: 9.7 mg/dL (ref 8.9–10.3)
CO2: 26 mmol/L (ref 22–32)
Chloride: 102 mmol/L (ref 101–111)
Creatinine, Ser: 0.97 mg/dL (ref 0.44–1.00)
GFR calc non Af Amer: >60 mL/min (ref >60)
Glucose, Bld: 108 mg/dL — ABNORMAL HIGH (ref 65–99)
Potassium: 3.4 mmol/L — ABNORMAL LOW (ref 3.5–5.1)
SODIUM: 136 mmol/L (ref 135–145)
Total Bilirubin: 0.9 mg/dL (ref 0.3–1.2)
Total Protein: 7.8 g/dL (ref 6.5–8.1)

## 2015-12-20 LAB — CBC WITH DIFFERENTIAL/PLATELET
Basophils Absolute: 0 10*3/uL (ref 0.0–0.1)
Basophils Relative: 1 %
EOS ABS: 0.1 10*3/uL (ref 0.0–0.7)
EOS PCT: 1 %
HCT: 43.2 % (ref 36.0–46.0)
Hemoglobin: 14.5 g/dL (ref 12.0–15.0)
Lymphocytes Relative: 31 %
Lymphs Abs: 2.6 10*3/uL (ref 0.7–4.0)
MCH: 29.7 pg (ref 26.0–34.0)
MCHC: 33.6 g/dL (ref 30.0–36.0)
MCV: 88.3 fL (ref 78.0–100.0)
MONO ABS: 0.6 10*3/uL (ref 0.1–1.0)
MONOS PCT: 7 %
NEUTROS ABS: 5.1 10*3/uL (ref 1.7–7.7)
NEUTROS PCT: 60 %
PLATELETS: 304 10*3/uL (ref 150–400)
RBC: 4.89 MIL/uL (ref 3.87–5.11)
RDW: 13.5 % (ref 11.5–15.5)
WBC: 8.4 10*3/uL (ref 4.0–10.5)

## 2015-12-20 LAB — I-STAT TROPONIN, ED: TROPONIN I, POC: 0 ng/mL (ref 0.00–0.08)

## 2015-12-20 MED ORDER — ONDANSETRON 4 MG PO TBDP
4.0000 mg | ORAL_TABLET | Freq: Once | ORAL | Status: AC
  Administered 2015-12-20: 4 mg via ORAL

## 2015-12-20 MED ORDER — ONDANSETRON 4 MG PO TBDP
ORAL_TABLET | ORAL | Status: AC
  Filled 2015-12-20: qty 1

## 2015-12-20 NOTE — ED Notes (Signed)
Called to take to room with no response.

## 2015-12-20 NOTE — ED Notes (Signed)
Pt called to place in room.  No response.

## 2015-12-20 NOTE — ED Notes (Addendum)
Per EMS- [place] and had new onset nausea, hot flashes. Pt then drove home and had a seizure like episode of shaking and vomiting in her car. Pt reports shortly after she had a sudden headache with left sided neck and shoulder pain, left arm numbness.Pt reports hx of cardiac event, unsure if it was an MI 10 years ago. Pt reports pain to head 10/10.

## 2015-12-20 NOTE — ED Notes (Signed)
Patient states that [place] and on the way home she become extremely nauseas with headache and ever since has had several episodes of vomiting associated with frontal headache blurred vision ringing in ears and leg cramping. Patient states she feels like she stayed hydrated yesterday.

## 2015-12-20 NOTE — ED Provider Notes (Signed)
CSN: [id number]     Arrival date & time 12/20/15  1002 History   First MD Initiated Contact with Patient 12/20/15 1045     Chief Complaint  Patient presents with  . Headache  . Emesis  . Blurred Vision  . 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 Approximately 6:00 PM she developed a sudden shaking all over and tremors followed by vomiting that has persisted numerous times overnight. During this time she developed a severe headache that she describes as the worst headache of her life that encompasses the bulk of the head and associated with blurred vision in the left eye and tinnitus. She is also complaining of muscle cramps. She thinks yesterday she may fall and bumped her elbow and the left side of her head. She is unsure. She has a history of ADD, did not take her medicine today, and she is loquacious today having a little trouble with her explanations and responses to questions. She is fully awake and oriented. She has taken Tylenol without relief. Her last episode of vomiting was 8:00 this morning. She continues to have the tinnitus that comes and goes as well as blurriness of vision in the left eye.    Past Medical History  Diagnosis Date  . ASCUS (atypical squamous cells of undetermined significance) on Pap smear 04/2010    NEG HPV  . GERD (gastroesophageal reflux disease)   . Hypothyroid   . Asthma     as achild, bronchitis 2016   Past Surgical History  Procedure Laterality Date  . Tubal ligation    . Augmentation mammaplasty     Family History  Problem Relation Age of Onset  . Other Father     Unknown  . Healthy Sister   . Healthy  Son   . Healthy Daughter    Social History  Substance Use Topics  . Smoking status: Never Smoker   . Smokeless tobacco: Never Used  . Alcohol Use: Yes     Comment: Rare   OB History    Gravida Para Term Preterm AB TAB SAB Ectopic Multiple Living   4 3   1     3      Review of Systems  Constitutional: Negative for fever, activity change and fatigue.  HENT: Negative for congestion, sore throat and trouble swallowing.   Eyes: Positive for visual disturbance.  Respiratory: Negative for cough and shortness of breath.   Cardiovascular: Negative for chest pain.  Gastrointestinal: Positive for nausea and vomiting. Negative for abdominal pain.  Genitourinary: Negative.   Musculoskeletal: Positive for myalgias, back pain and neck pain.  Skin: Negative.   Neurological: Positive for weakness and headaches. Negative for syncope, facial asymmetry and speech difficulty.  Psychiatric/Behavioral: Positive for decreased concentration. Negative for confusion. The patient is hyperactive.     Allergies  Review of patient's allergies indicates no known allergies.  Home Medications   Prior to Admission medications   Medication Sig Start Date End Date Taking? Authorizing Provider  thyroid [facility](ARMOUR THYROID) 120 MG tablet Take 1 tablet (120 mg total) by mouth daily before breakfast. 06/29/14  Yes Terressa KoyanagiHannah R Kim, DO  albuterol (PROVENTIL HFA;VENTOLIN HFA) 108 (90 BASE) MCG/ACT inhaler Inhale 2 puffs into the lungs  every 6 (six) hours as needed. 05/06/15   Terressa Koyanagi, DO  ALPRAZolam Prudy Feeler) 0.25 MG tablet Take 0.25 mg by mouth at bedtime as needed for anxiety or sleep.    Historical Provider, MD  amphetamine-dextroamphetamine (ADDERALL XR) 30 MG 24 hr capsule Take 30 mg by mouth daily.    Historical Provider, MD  amphetamine-dextroamphetamine (ADDERALL) 20 MG tablet Take 10-20 mg by mouth 3 (three) times daily.     Historical Provider, MD  beclomethasone (QVAR) 40 MCG/ACT inhaler Inhale 1 puff into the  lungs 2 (two) times daily. 05/18/15   Terressa Koyanagi, DO  benzonatate (TESSALON PERLES) 100 MG capsule Take 1 capsule (100 mg total) by mouth 3 (three) times daily as needed for cough. 05/18/15   Terressa Koyanagi, DO  BIOTIN PO Take 1 tablet by mouth daily.     Historical Provider, MD  Cholecalciferol (VITAMIN D PO) Take 1 tablet by mouth daily.    Historical Provider, MD  Cyanocobalamin (VITAMIN B 12 PO) Take 1 tablet by mouth daily.    Historical Provider, MD  ibuprofen (ADVIL,MOTRIN) 800 MG tablet Take 1 tablet (800 mg total) by mouth 2 (two) times daily as needed. Patient taking differently: Take 800 mg by mouth 2 (two) times daily as needed for moderate pain.  03/09/15   Terressa Koyanagi, DO  nortriptyline (PAMELOR) 10 MG capsule Take 1 capsule (10 mg total) by mouth at bedtime. 01/07/15   Kristian Covey, MD  predniSONE (DELTASONE) 20 MG tablet Take 3 tablets (60 mg total) by mouth daily with breakfast. 05/25/15   Melton Krebs, PA-C  tizanidine (ZANAFLEX) 2 MG capsule Take 1 capsule (2 mg total) by mouth 2 (two) times daily as needed for muscle spasms. 03/09/15   Terressa Koyanagi, DO   Meds Ordered and Administered this Visit   Medications  ondansetron (ZOFRAN-ODT) disintegrating tablet 4 mg (not administered)    BP 139/96 mmHg  Pulse 77  Temp(Src) 98.4 F (36.9 C) (Oral)  Resp 17  SpO2 100%  LMP 12/11/2015 No data found.   Physical Exam  Constitutional: She is oriented to person, place, and time. She appears well-developed and well-nourished. No distress.  HENT:  Mouth/Throat: No oropharyngeal exudate.  Small hematoma to the right parietal scalp. Bilateral TMs are normal. No hemotympanum. Oropharynx is clear. Soft palate rises symmetrically. Tongue and uvula midline.   Eyes: Conjunctivae and EOM are normal.  Neck: Normal range of motion.  Tenderness to the paracervical musculature.  Cardiovascular: Normal rate, regular rhythm and normal heart sounds.   Pulmonary/Chest: Effort  normal and breath sounds normal. No respiratory distress. She has no wheezes.  Musculoskeletal: She exhibits no edema.  Various areas of tenderness particular to the left side of the body from the neck to the feet.  Lymphadenopathy:    She has no cervical adenopathy.  Neurological: She is alert and oriented to person, place, and time. No cranial nerve deficit or sensory deficit. She displays no seizure activity. GCS eye subscore is 4. GCS verbal subscore is 5. GCS motor subscore is 6.  Reflex Scores:      Patellar reflexes are 4+ on the right side.      Achilles reflexes are 2+ on the left side. Motor strength is 4/5 on the left 5 over 5 on the right.  Psychiatric: Thought content normal. Her mood appears anxious. Her speech is rapid and/or pressured and tangential. Her speech is not slurred. She is hyperactive. She is not  agitated.  Nursing note and vitals reviewed.   ED Course  Procedures (including critical care time)  Labs Review Labs Reviewed - No data to display  Imaging Review No results found.   Visual Acuity Review  Right Eye Distance:   Left Eye Distance:   Bilateral Distance:    Right Eye Near:   Left Eye Near:    Bilateral Near:         MDM   1. Acute intractable headache, unspecified headache type   2. Non-intractable vomiting with nausea, vomiting of unspecified type   3. Vision changes   4. Tinnitus, unspecified laterality   5. Scalp hematoma, initial encounter   6. Heat exposure, initial encounter    This 50 year old female is complaining of the worst headache of her life associated with vomiting for several hours. She also has persistent blurred vision to left eye. She is also having muscle cramps. She is somewhat difficult to evaluate because she has not taken her ADD medicine today and she is quite loquacious and tends to be a bit scattered and trying to offer her history. She also has a history of regional pain syndrome in which she has deficits to  the left side of her body. Certainly there may be an element of he related illness however the sudden onset of shaking, tremors followed by a sudden vomiting and the worst headache of her life is concerning.    Hayden Rasmussen, NP 12/20/15 [phone]

## 2015-12-20 NOTE — Progress Notes (Signed)
NO SHOW

## 2015-12-20 NOTE — ED Notes (Signed)
Called to take back to room.  No response. 

## 2015-12-20 NOTE — ED Notes (Signed)
Report called to Nurse First RN

## 2015-12-20 NOTE — ED Notes (Signed)
Pt presents she also has problems with her thyroid.

## 2016-09-13 IMAGING — CT CT RENAL STONE PROTOCOL
2 of 4 series · 16 of 46 positions shown, 18 images · non-contrast
Comparison: CT scan of June 19, 2013.

CLINICAL DATA: Acute left flank pain.

EXAM:
CT ABDOMEN AND PELVIS WITHOUT CONTRAST
TECHNIQUE: Multidetector CT imaging of the abdomen and pelvis was performed
following the standard protocol without IV contrast.

[Series 2: standard/full over (age)lbs 5.0 · axial · 0.64mm/px · z∈[-474,-14]mm · 13 of 100 slices shown, 15 images]
[im 4/100  soft-tissue]
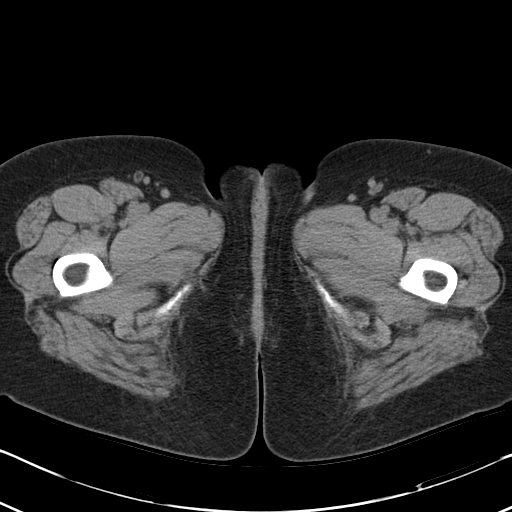
[im 4/100  bone]
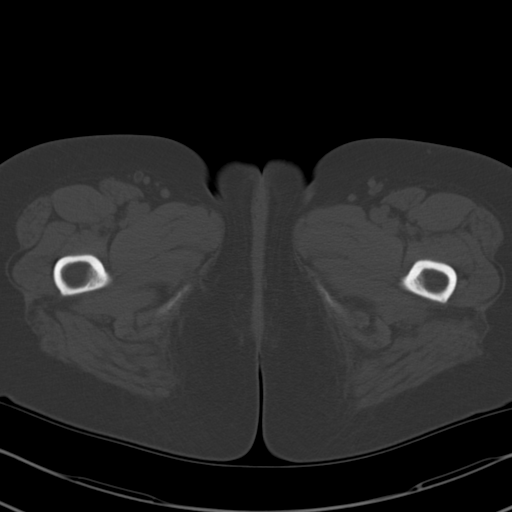
[im 12/100  soft-tissue]
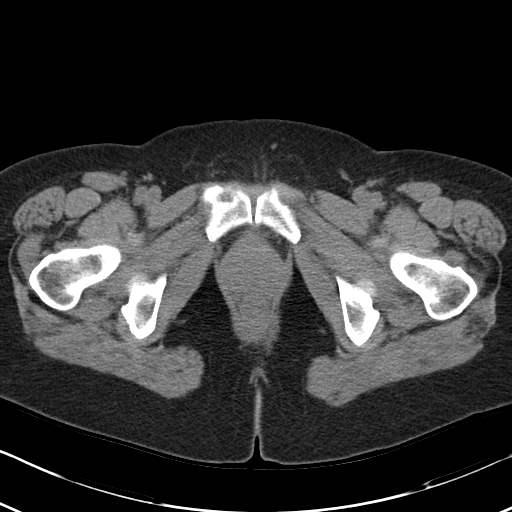
[im 20/100  soft-tissue]
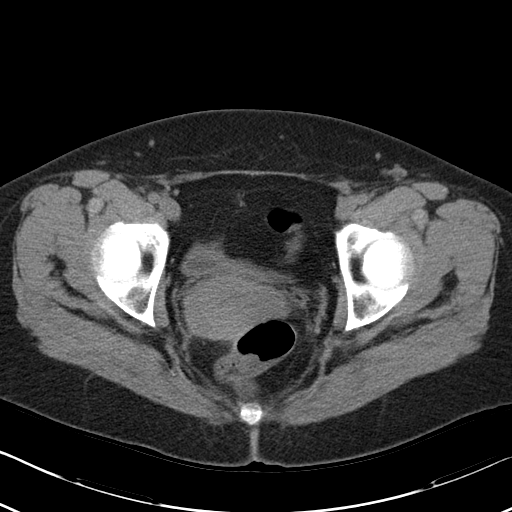
[im 28/100  soft-tissue]
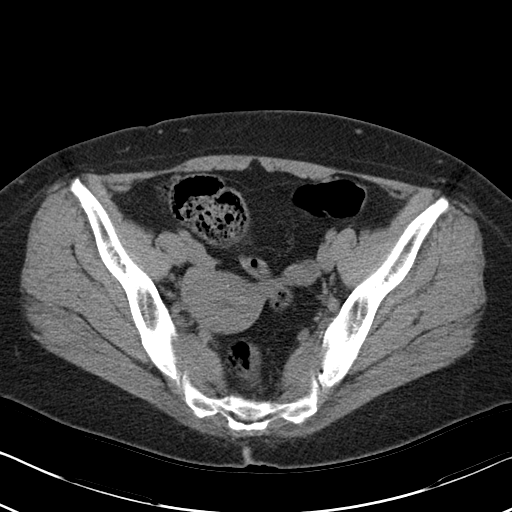
[im 36/100  soft-tissue]
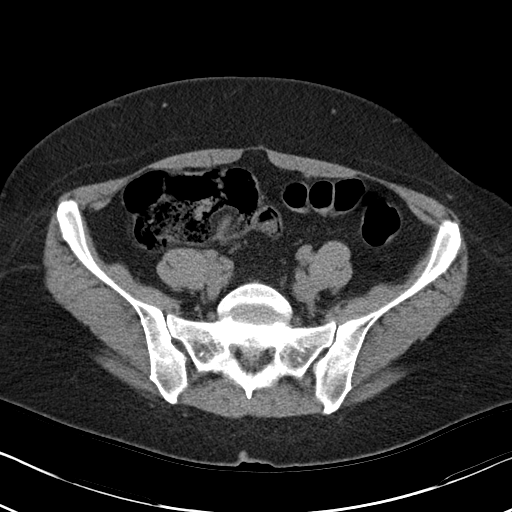
[im 44/100  soft-tissue]
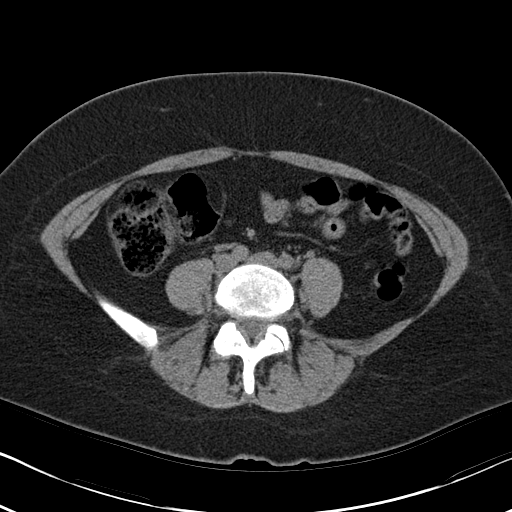
[im 52/100  soft-tissue]
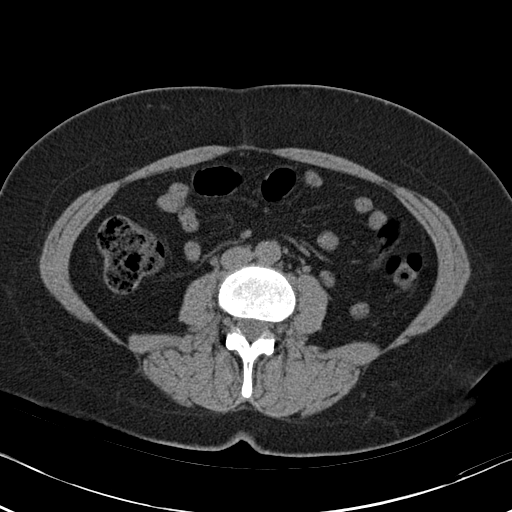
[im 56/100  soft-tissue]
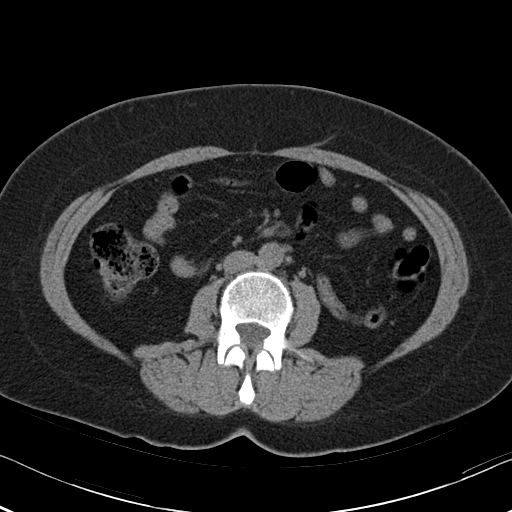
[im 64/100  soft-tissue]
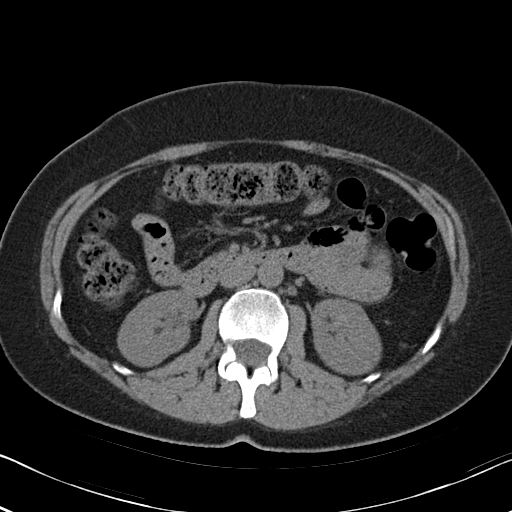
[im 64/100  bone]
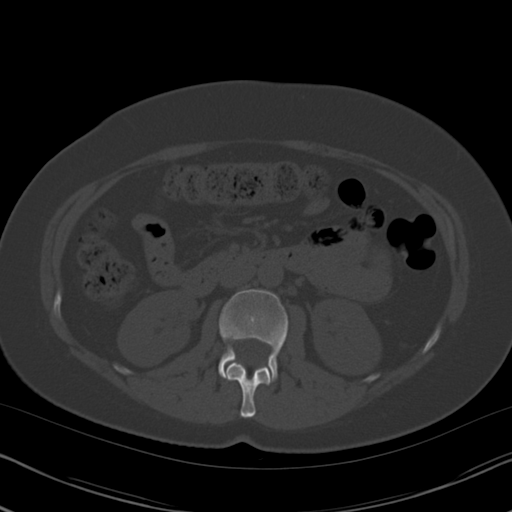
[im 72/100  soft-tissue]
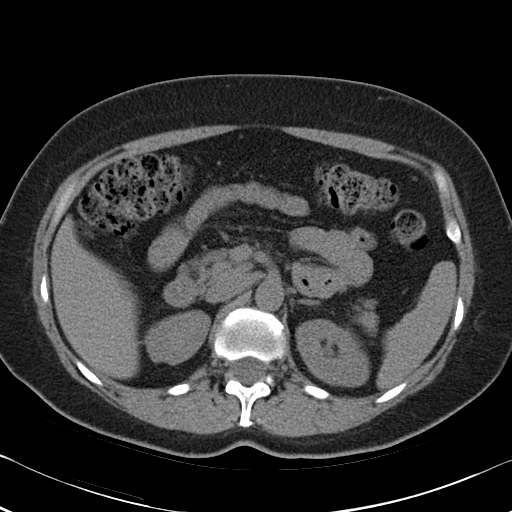
[im 80/100  soft-tissue]
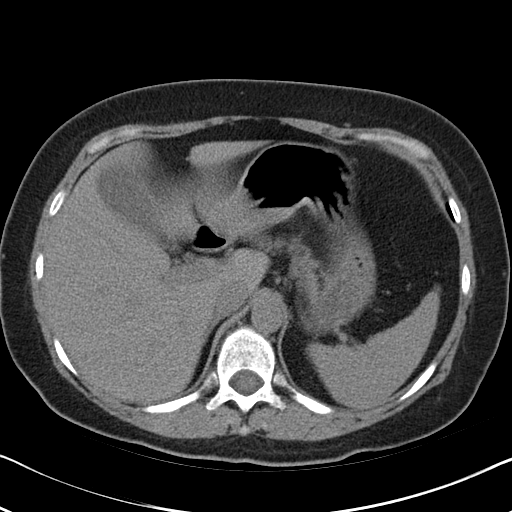
[im 88/100  soft-tissue]
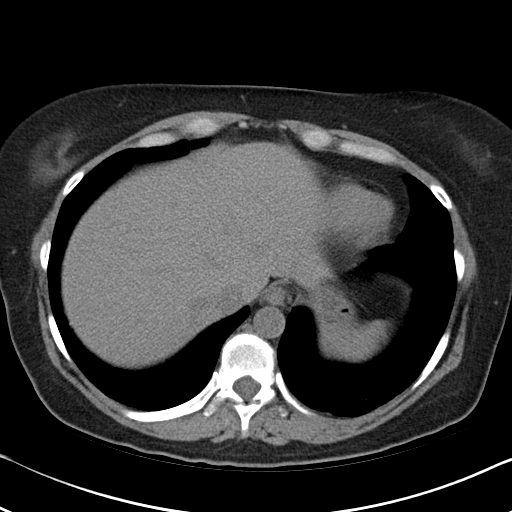
[im 96/100  soft-tissue]
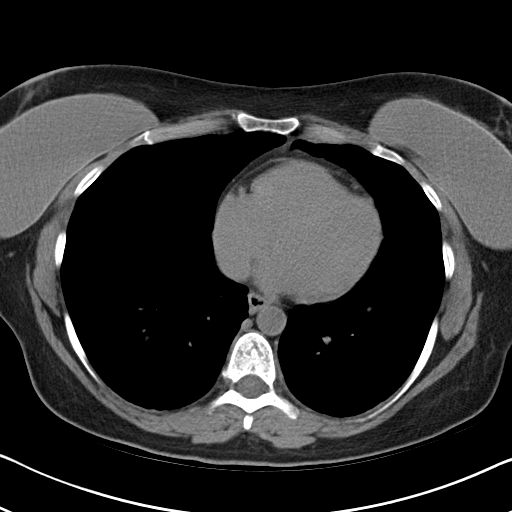

[Series 4: mpr coronal · coronal · 0.69mm/px · 3 of 84 slices shown]
[im 28/84  soft-tissue]
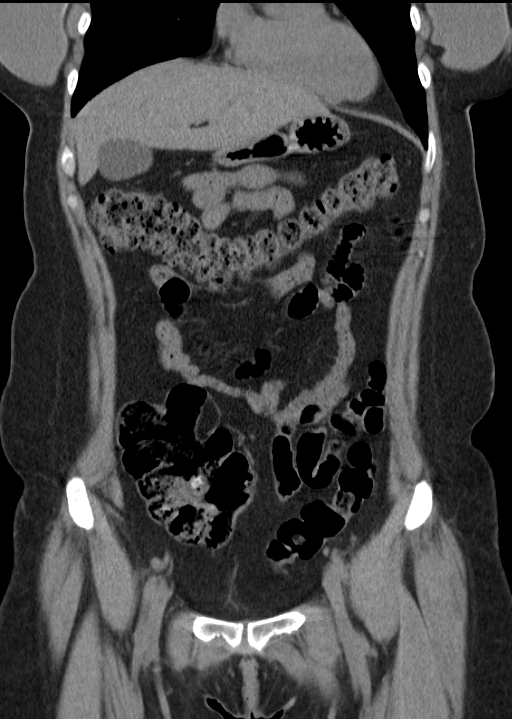
[im 37/84  soft-tissue]
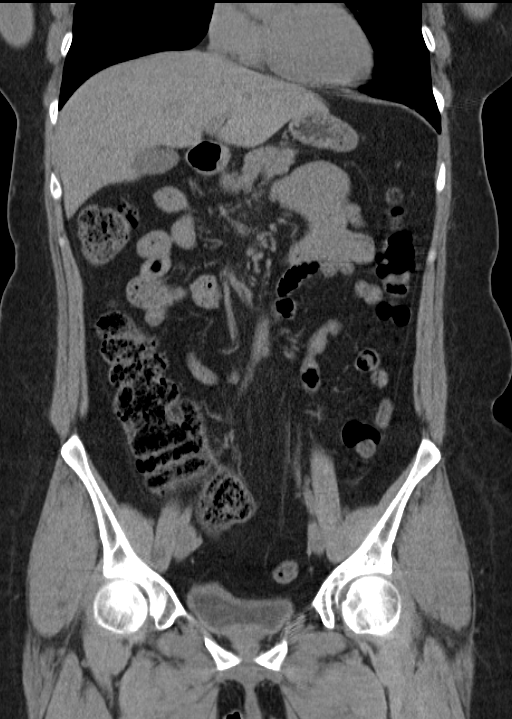
[im 47/84  soft-tissue]
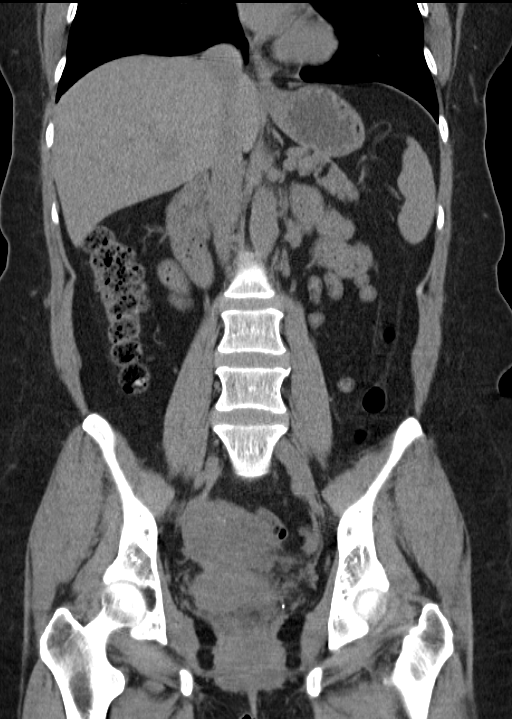

[16 of 46 positions shown; findings below may reference images not displayed]

FINDINGS: Visualized lung bases appear normal. No significant osseous
abnormality is noted.

No gallstones are noted. The liver, spleen and pancreas appear
normal. Adrenal glands and kidneys appear normal. No hydronephrosis
or renal obstruction is noted. No renal or ureteral calculi are
noted. There is no evidence of bowel obstruction. The appendix
appears normal. Uterus and ovaries appear normal. Urinary bladder is
decompressed. No abnormal fluid collection is noted. No significant
adenopathy is noted.
IMPRESSION: No acute abnormality seen in the abdomen or pelvis.
